# Patient Record
Sex: Female | Born: 1945
Health system: Southern US, Community
[De-identification: ages and names within clinical notes are randomized; demographics above are authoritative.]

## PROBLEM LIST (undated history)

## (undated) DIAGNOSIS — K219 Gastro-esophageal reflux disease without esophagitis: Secondary | ICD-10-CM

## (undated) DIAGNOSIS — E119 Type 2 diabetes mellitus without complications: Secondary | ICD-10-CM

## (undated) DIAGNOSIS — I10 Essential (primary) hypertension: Secondary | ICD-10-CM

## (undated) HISTORY — PX: ANKLE FRACTURE SURGERY: SHX122

---

## 2016-01-08 DIAGNOSIS — Z79899 Other long term (current) drug therapy: Secondary | ICD-10-CM | POA: Diagnosis not present

## 2016-01-08 DIAGNOSIS — I10 Essential (primary) hypertension: Secondary | ICD-10-CM | POA: Diagnosis not present

## 2016-01-08 DIAGNOSIS — E559 Vitamin D deficiency, unspecified: Secondary | ICD-10-CM | POA: Diagnosis not present

## 2016-01-08 DIAGNOSIS — R1084 Generalized abdominal pain: Secondary | ICD-10-CM | POA: Diagnosis not present

## 2016-01-08 DIAGNOSIS — E782 Mixed hyperlipidemia: Secondary | ICD-10-CM | POA: Diagnosis not present

## 2016-01-08 DIAGNOSIS — Z0000 Encounter for general adult medical examination without abnormal findings: Secondary | ICD-10-CM | POA: Diagnosis not present

## 2016-01-08 DIAGNOSIS — E1165 Type 2 diabetes mellitus with hyperglycemia: Secondary | ICD-10-CM | POA: Diagnosis not present

## 2016-04-15 DIAGNOSIS — E559 Vitamin D deficiency, unspecified: Secondary | ICD-10-CM | POA: Diagnosis not present

## 2016-04-15 DIAGNOSIS — Z79899 Other long term (current) drug therapy: Secondary | ICD-10-CM | POA: Diagnosis not present

## 2016-04-15 DIAGNOSIS — R7301 Impaired fasting glucose: Secondary | ICD-10-CM | POA: Diagnosis not present

## 2016-04-15 DIAGNOSIS — D518 Other vitamin B12 deficiency anemias: Secondary | ICD-10-CM | POA: Diagnosis not present

## 2016-04-15 DIAGNOSIS — E038 Other specified hypothyroidism: Secondary | ICD-10-CM | POA: Diagnosis not present

## 2016-04-15 DIAGNOSIS — E782 Mixed hyperlipidemia: Secondary | ICD-10-CM | POA: Diagnosis not present

## 2016-04-15 DIAGNOSIS — E1165 Type 2 diabetes mellitus with hyperglycemia: Secondary | ICD-10-CM | POA: Diagnosis not present

## 2016-04-23 DIAGNOSIS — R799 Abnormal finding of blood chemistry, unspecified: Secondary | ICD-10-CM | POA: Diagnosis not present

## 2016-04-23 DIAGNOSIS — R945 Abnormal results of liver function studies: Secondary | ICD-10-CM | POA: Diagnosis not present

## 2016-04-23 DIAGNOSIS — D649 Anemia, unspecified: Secondary | ICD-10-CM | POA: Diagnosis not present

## 2016-07-16 DIAGNOSIS — E782 Mixed hyperlipidemia: Secondary | ICD-10-CM | POA: Diagnosis not present

## 2016-07-16 DIAGNOSIS — I10 Essential (primary) hypertension: Secondary | ICD-10-CM | POA: Diagnosis not present

## 2016-07-16 DIAGNOSIS — Z1389 Encounter for screening for other disorder: Secondary | ICD-10-CM | POA: Diagnosis not present

## 2016-07-16 DIAGNOSIS — E559 Vitamin D deficiency, unspecified: Secondary | ICD-10-CM | POA: Diagnosis not present

## 2016-07-16 DIAGNOSIS — D518 Other vitamin B12 deficiency anemias: Secondary | ICD-10-CM | POA: Diagnosis not present

## 2016-07-16 DIAGNOSIS — E1165 Type 2 diabetes mellitus with hyperglycemia: Secondary | ICD-10-CM | POA: Diagnosis not present

## 2016-07-16 DIAGNOSIS — E038 Other specified hypothyroidism: Secondary | ICD-10-CM | POA: Diagnosis not present

## 2016-10-22 DIAGNOSIS — E1165 Type 2 diabetes mellitus with hyperglycemia: Secondary | ICD-10-CM | POA: Diagnosis not present

## 2016-10-22 DIAGNOSIS — E559 Vitamin D deficiency, unspecified: Secondary | ICD-10-CM | POA: Diagnosis not present

## 2016-10-22 DIAGNOSIS — Z79899 Other long term (current) drug therapy: Secondary | ICD-10-CM | POA: Diagnosis not present

## 2016-10-22 DIAGNOSIS — E038 Other specified hypothyroidism: Secondary | ICD-10-CM | POA: Diagnosis not present

## 2016-10-22 DIAGNOSIS — D518 Other vitamin B12 deficiency anemias: Secondary | ICD-10-CM | POA: Diagnosis not present

## 2016-10-22 DIAGNOSIS — I10 Essential (primary) hypertension: Secondary | ICD-10-CM | POA: Diagnosis not present

## 2016-10-22 DIAGNOSIS — E782 Mixed hyperlipidemia: Secondary | ICD-10-CM | POA: Diagnosis not present

## 2016-12-31 DIAGNOSIS — E1165 Type 2 diabetes mellitus with hyperglycemia: Secondary | ICD-10-CM | POA: Diagnosis not present

## 2016-12-31 DIAGNOSIS — F330 Major depressive disorder, recurrent, mild: Secondary | ICD-10-CM | POA: Diagnosis not present

## 2016-12-31 DIAGNOSIS — I10 Essential (primary) hypertension: Secondary | ICD-10-CM | POA: Diagnosis not present

## 2016-12-31 DIAGNOSIS — E782 Mixed hyperlipidemia: Secondary | ICD-10-CM | POA: Diagnosis not present

## 2017-01-27 DIAGNOSIS — E038 Other specified hypothyroidism: Secondary | ICD-10-CM | POA: Diagnosis not present

## 2017-01-27 DIAGNOSIS — K219 Gastro-esophageal reflux disease without esophagitis: Secondary | ICD-10-CM | POA: Diagnosis not present

## 2017-01-27 DIAGNOSIS — Z0000 Encounter for general adult medical examination without abnormal findings: Secondary | ICD-10-CM | POA: Diagnosis not present

## 2017-01-27 DIAGNOSIS — E1165 Type 2 diabetes mellitus with hyperglycemia: Secondary | ICD-10-CM | POA: Diagnosis not present

## 2017-01-27 DIAGNOSIS — E559 Vitamin D deficiency, unspecified: Secondary | ICD-10-CM | POA: Diagnosis not present

## 2017-01-27 DIAGNOSIS — D518 Other vitamin B12 deficiency anemias: Secondary | ICD-10-CM | POA: Diagnosis not present

## 2017-01-27 DIAGNOSIS — I10 Essential (primary) hypertension: Secondary | ICD-10-CM | POA: Diagnosis not present

## 2017-01-27 DIAGNOSIS — E782 Mixed hyperlipidemia: Secondary | ICD-10-CM | POA: Diagnosis not present

## 2017-02-11 DIAGNOSIS — E1165 Type 2 diabetes mellitus with hyperglycemia: Secondary | ICD-10-CM | POA: Diagnosis not present

## 2017-02-11 DIAGNOSIS — F330 Major depressive disorder, recurrent, mild: Secondary | ICD-10-CM | POA: Diagnosis not present

## 2017-02-11 DIAGNOSIS — E782 Mixed hyperlipidemia: Secondary | ICD-10-CM | POA: Diagnosis not present

## 2017-02-11 DIAGNOSIS — I10 Essential (primary) hypertension: Secondary | ICD-10-CM | POA: Diagnosis not present

## 2017-03-07 DIAGNOSIS — F330 Major depressive disorder, recurrent, mild: Secondary | ICD-10-CM | POA: Diagnosis not present

## 2017-03-07 DIAGNOSIS — I10 Essential (primary) hypertension: Secondary | ICD-10-CM | POA: Diagnosis not present

## 2017-03-07 DIAGNOSIS — E1165 Type 2 diabetes mellitus with hyperglycemia: Secondary | ICD-10-CM | POA: Diagnosis not present

## 2017-03-07 DIAGNOSIS — E782 Mixed hyperlipidemia: Secondary | ICD-10-CM | POA: Diagnosis not present

## 2017-04-07 DIAGNOSIS — I10 Essential (primary) hypertension: Secondary | ICD-10-CM | POA: Diagnosis not present

## 2017-04-07 DIAGNOSIS — F330 Major depressive disorder, recurrent, mild: Secondary | ICD-10-CM | POA: Diagnosis not present

## 2017-04-07 DIAGNOSIS — E782 Mixed hyperlipidemia: Secondary | ICD-10-CM | POA: Diagnosis not present

## 2017-04-07 DIAGNOSIS — E1165 Type 2 diabetes mellitus with hyperglycemia: Secondary | ICD-10-CM | POA: Diagnosis not present

## 2017-05-05 DIAGNOSIS — I10 Essential (primary) hypertension: Secondary | ICD-10-CM | POA: Diagnosis not present

## 2017-05-05 DIAGNOSIS — E1165 Type 2 diabetes mellitus with hyperglycemia: Secondary | ICD-10-CM | POA: Diagnosis not present

## 2017-05-05 DIAGNOSIS — E038 Other specified hypothyroidism: Secondary | ICD-10-CM | POA: Diagnosis not present

## 2017-05-05 DIAGNOSIS — E559 Vitamin D deficiency, unspecified: Secondary | ICD-10-CM | POA: Diagnosis not present

## 2017-05-05 DIAGNOSIS — E782 Mixed hyperlipidemia: Secondary | ICD-10-CM | POA: Diagnosis not present

## 2017-05-05 DIAGNOSIS — D518 Other vitamin B12 deficiency anemias: Secondary | ICD-10-CM | POA: Diagnosis not present

## 2017-05-08 DIAGNOSIS — E1165 Type 2 diabetes mellitus with hyperglycemia: Secondary | ICD-10-CM | POA: Diagnosis not present

## 2017-05-08 DIAGNOSIS — E782 Mixed hyperlipidemia: Secondary | ICD-10-CM | POA: Diagnosis not present

## 2017-05-08 DIAGNOSIS — I10 Essential (primary) hypertension: Secondary | ICD-10-CM | POA: Diagnosis not present

## 2017-05-08 DIAGNOSIS — F330 Major depressive disorder, recurrent, mild: Secondary | ICD-10-CM | POA: Diagnosis not present

## 2017-08-29 DIAGNOSIS — D518 Other vitamin B12 deficiency anemias: Secondary | ICD-10-CM | POA: Diagnosis not present

## 2017-08-29 DIAGNOSIS — E559 Vitamin D deficiency, unspecified: Secondary | ICD-10-CM | POA: Diagnosis not present

## 2017-08-29 DIAGNOSIS — E1165 Type 2 diabetes mellitus with hyperglycemia: Secondary | ICD-10-CM | POA: Diagnosis not present

## 2017-08-29 DIAGNOSIS — E782 Mixed hyperlipidemia: Secondary | ICD-10-CM | POA: Diagnosis not present

## 2017-08-29 DIAGNOSIS — I10 Essential (primary) hypertension: Secondary | ICD-10-CM | POA: Diagnosis not present

## 2017-08-29 DIAGNOSIS — E038 Other specified hypothyroidism: Secondary | ICD-10-CM | POA: Diagnosis not present

## 2017-10-29 DIAGNOSIS — E1165 Type 2 diabetes mellitus with hyperglycemia: Secondary | ICD-10-CM | POA: Diagnosis not present

## 2017-10-29 DIAGNOSIS — E782 Mixed hyperlipidemia: Secondary | ICD-10-CM | POA: Diagnosis not present

## 2017-10-29 DIAGNOSIS — I10 Essential (primary) hypertension: Secondary | ICD-10-CM | POA: Diagnosis not present

## 2017-10-29 DIAGNOSIS — F330 Major depressive disorder, recurrent, mild: Secondary | ICD-10-CM | POA: Diagnosis not present

## 2017-11-27 DIAGNOSIS — F330 Major depressive disorder, recurrent, mild: Secondary | ICD-10-CM | POA: Diagnosis not present

## 2017-11-27 DIAGNOSIS — E1165 Type 2 diabetes mellitus with hyperglycemia: Secondary | ICD-10-CM | POA: Diagnosis not present

## 2017-11-27 DIAGNOSIS — E782 Mixed hyperlipidemia: Secondary | ICD-10-CM | POA: Diagnosis not present

## 2017-11-27 DIAGNOSIS — I10 Essential (primary) hypertension: Secondary | ICD-10-CM | POA: Diagnosis not present

## 2017-12-01 DIAGNOSIS — E1165 Type 2 diabetes mellitus with hyperglycemia: Secondary | ICD-10-CM | POA: Diagnosis not present

## 2017-12-01 DIAGNOSIS — D518 Other vitamin B12 deficiency anemias: Secondary | ICD-10-CM | POA: Diagnosis not present

## 2017-12-01 DIAGNOSIS — E038 Other specified hypothyroidism: Secondary | ICD-10-CM | POA: Diagnosis not present

## 2017-12-01 DIAGNOSIS — I10 Essential (primary) hypertension: Secondary | ICD-10-CM | POA: Diagnosis not present

## 2017-12-01 DIAGNOSIS — E559 Vitamin D deficiency, unspecified: Secondary | ICD-10-CM | POA: Diagnosis not present

## 2017-12-01 DIAGNOSIS — E782 Mixed hyperlipidemia: Secondary | ICD-10-CM | POA: Diagnosis not present

## 2017-12-29 DIAGNOSIS — E782 Mixed hyperlipidemia: Secondary | ICD-10-CM | POA: Diagnosis not present

## 2017-12-29 DIAGNOSIS — F330 Major depressive disorder, recurrent, mild: Secondary | ICD-10-CM | POA: Diagnosis not present

## 2017-12-29 DIAGNOSIS — E1165 Type 2 diabetes mellitus with hyperglycemia: Secondary | ICD-10-CM | POA: Diagnosis not present

## 2017-12-29 DIAGNOSIS — I10 Essential (primary) hypertension: Secondary | ICD-10-CM | POA: Diagnosis not present

## 2018-01-19 DIAGNOSIS — S52125A Nondisplaced fracture of head of left radius, initial encounter for closed fracture: Secondary | ICD-10-CM | POA: Diagnosis not present

## 2018-01-21 DIAGNOSIS — S52134A Nondisplaced fracture of neck of right radius, initial encounter for closed fracture: Secondary | ICD-10-CM | POA: Diagnosis not present

## 2018-01-26 DIAGNOSIS — I10 Essential (primary) hypertension: Secondary | ICD-10-CM | POA: Diagnosis not present

## 2018-01-26 DIAGNOSIS — E1165 Type 2 diabetes mellitus with hyperglycemia: Secondary | ICD-10-CM | POA: Diagnosis not present

## 2018-01-26 DIAGNOSIS — E782 Mixed hyperlipidemia: Secondary | ICD-10-CM | POA: Diagnosis not present

## 2018-01-26 DIAGNOSIS — F330 Major depressive disorder, recurrent, mild: Secondary | ICD-10-CM | POA: Diagnosis not present

## 2018-03-02 DIAGNOSIS — E559 Vitamin D deficiency, unspecified: Secondary | ICD-10-CM | POA: Diagnosis not present

## 2018-03-02 DIAGNOSIS — E1165 Type 2 diabetes mellitus with hyperglycemia: Secondary | ICD-10-CM | POA: Diagnosis not present

## 2018-03-02 DIAGNOSIS — E038 Other specified hypothyroidism: Secondary | ICD-10-CM | POA: Diagnosis not present

## 2018-03-02 DIAGNOSIS — E782 Mixed hyperlipidemia: Secondary | ICD-10-CM | POA: Diagnosis not present

## 2018-03-02 DIAGNOSIS — I10 Essential (primary) hypertension: Secondary | ICD-10-CM | POA: Diagnosis not present

## 2018-03-02 DIAGNOSIS — D518 Other vitamin B12 deficiency anemias: Secondary | ICD-10-CM | POA: Diagnosis not present

## 2018-03-04 DIAGNOSIS — S52134A Nondisplaced fracture of neck of right radius, initial encounter for closed fracture: Secondary | ICD-10-CM | POA: Diagnosis not present

## 2018-03-27 DIAGNOSIS — S52134A Nondisplaced fracture of neck of right radius, initial encounter for closed fracture: Secondary | ICD-10-CM | POA: Diagnosis not present

## 2018-06-02 DIAGNOSIS — E119 Type 2 diabetes mellitus without complications: Secondary | ICD-10-CM | POA: Diagnosis not present

## 2018-06-02 DIAGNOSIS — D518 Other vitamin B12 deficiency anemias: Secondary | ICD-10-CM | POA: Diagnosis not present

## 2018-06-02 DIAGNOSIS — I10 Essential (primary) hypertension: Secondary | ICD-10-CM | POA: Diagnosis not present

## 2018-06-02 DIAGNOSIS — K219 Gastro-esophageal reflux disease without esophagitis: Secondary | ICD-10-CM | POA: Diagnosis not present

## 2018-06-02 DIAGNOSIS — E559 Vitamin D deficiency, unspecified: Secondary | ICD-10-CM | POA: Diagnosis not present

## 2018-06-02 DIAGNOSIS — E1165 Type 2 diabetes mellitus with hyperglycemia: Secondary | ICD-10-CM | POA: Diagnosis not present

## 2018-06-02 DIAGNOSIS — E038 Other specified hypothyroidism: Secondary | ICD-10-CM | POA: Diagnosis not present

## 2018-06-02 DIAGNOSIS — E782 Mixed hyperlipidemia: Secondary | ICD-10-CM | POA: Diagnosis not present

## 2018-06-16 DIAGNOSIS — S8251XA Displaced fracture of medial malleolus of right tibia, initial encounter for closed fracture: Secondary | ICD-10-CM | POA: Diagnosis not present

## 2018-06-16 DIAGNOSIS — S8261XA Displaced fracture of lateral malleolus of right fibula, initial encounter for closed fracture: Secondary | ICD-10-CM | POA: Diagnosis not present

## 2018-06-16 DIAGNOSIS — M25571 Pain in right ankle and joints of right foot: Secondary | ICD-10-CM | POA: Diagnosis not present

## 2018-06-16 DIAGNOSIS — S82841A Displaced bimalleolar fracture of right lower leg, initial encounter for closed fracture: Secondary | ICD-10-CM | POA: Diagnosis not present

## 2018-06-17 DIAGNOSIS — S82841A Displaced bimalleolar fracture of right lower leg, initial encounter for closed fracture: Secondary | ICD-10-CM | POA: Diagnosis not present

## 2018-06-18 DIAGNOSIS — S82841A Displaced bimalleolar fracture of right lower leg, initial encounter for closed fracture: Secondary | ICD-10-CM | POA: Diagnosis not present

## 2018-06-23 DIAGNOSIS — Z7982 Long term (current) use of aspirin: Secondary | ICD-10-CM | POA: Diagnosis not present

## 2018-06-23 DIAGNOSIS — F418 Other specified anxiety disorders: Secondary | ICD-10-CM | POA: Diagnosis not present

## 2018-06-23 DIAGNOSIS — I10 Essential (primary) hypertension: Secondary | ICD-10-CM | POA: Diagnosis not present

## 2018-06-23 DIAGNOSIS — S82841A Displaced bimalleolar fracture of right lower leg, initial encounter for closed fracture: Secondary | ICD-10-CM | POA: Diagnosis not present

## 2018-06-23 DIAGNOSIS — Z01810 Encounter for preprocedural cardiovascular examination: Secondary | ICD-10-CM | POA: Diagnosis not present

## 2018-06-23 DIAGNOSIS — E038 Other specified hypothyroidism: Secondary | ICD-10-CM | POA: Diagnosis not present

## 2018-06-23 DIAGNOSIS — R9431 Abnormal electrocardiogram [ECG] [EKG]: Secondary | ICD-10-CM | POA: Diagnosis not present

## 2018-06-23 DIAGNOSIS — I4510 Unspecified right bundle-branch block: Secondary | ICD-10-CM | POA: Diagnosis not present

## 2018-06-23 DIAGNOSIS — F330 Major depressive disorder, recurrent, mild: Secondary | ICD-10-CM | POA: Diagnosis not present

## 2018-06-23 DIAGNOSIS — Z7984 Long term (current) use of oral hypoglycemic drugs: Secondary | ICD-10-CM | POA: Diagnosis not present

## 2018-06-23 DIAGNOSIS — Z79899 Other long term (current) drug therapy: Secondary | ICD-10-CM | POA: Diagnosis not present

## 2018-06-23 DIAGNOSIS — R001 Bradycardia, unspecified: Secondary | ICD-10-CM | POA: Diagnosis not present

## 2018-06-23 DIAGNOSIS — E119 Type 2 diabetes mellitus without complications: Secondary | ICD-10-CM | POA: Diagnosis not present

## 2018-06-23 DIAGNOSIS — I2510 Atherosclerotic heart disease of native coronary artery without angina pectoris: Secondary | ICD-10-CM | POA: Diagnosis not present

## 2018-06-23 DIAGNOSIS — Z01818 Encounter for other preprocedural examination: Secondary | ICD-10-CM

## 2018-06-23 DIAGNOSIS — D518 Other vitamin B12 deficiency anemias: Secondary | ICD-10-CM | POA: Diagnosis not present

## 2018-06-23 DIAGNOSIS — M80071A Age-related osteoporosis with current pathological fracture, right ankle and foot, initial encounter for fracture: Secondary | ICD-10-CM | POA: Diagnosis not present

## 2018-06-23 DIAGNOSIS — E782 Mixed hyperlipidemia: Secondary | ICD-10-CM | POA: Diagnosis not present

## 2018-06-23 DIAGNOSIS — E559 Vitamin D deficiency, unspecified: Secondary | ICD-10-CM | POA: Diagnosis not present

## 2018-06-23 DIAGNOSIS — K219 Gastro-esophageal reflux disease without esophagitis: Secondary | ICD-10-CM | POA: Diagnosis not present

## 2018-06-23 DIAGNOSIS — M810 Age-related osteoporosis without current pathological fracture: Secondary | ICD-10-CM | POA: Diagnosis not present

## 2018-07-06 DIAGNOSIS — S82841A Displaced bimalleolar fracture of right lower leg, initial encounter for closed fracture: Secondary | ICD-10-CM | POA: Diagnosis not present

## 2018-07-07 DIAGNOSIS — S82841A Displaced bimalleolar fracture of right lower leg, initial encounter for closed fracture: Secondary | ICD-10-CM | POA: Diagnosis not present

## 2018-08-03 DIAGNOSIS — S82841A Displaced bimalleolar fracture of right lower leg, initial encounter for closed fracture: Secondary | ICD-10-CM | POA: Diagnosis not present

## 2018-08-31 DIAGNOSIS — S82841A Displaced bimalleolar fracture of right lower leg, initial encounter for closed fracture: Secondary | ICD-10-CM | POA: Diagnosis not present

## 2018-09-10 DIAGNOSIS — K219 Gastro-esophageal reflux disease without esophagitis: Secondary | ICD-10-CM | POA: Diagnosis not present

## 2018-09-10 DIAGNOSIS — Z79899 Other long term (current) drug therapy: Secondary | ICD-10-CM | POA: Diagnosis not present

## 2018-09-10 DIAGNOSIS — E782 Mixed hyperlipidemia: Secondary | ICD-10-CM | POA: Diagnosis not present

## 2018-09-10 DIAGNOSIS — I10 Essential (primary) hypertension: Secondary | ICD-10-CM | POA: Diagnosis not present

## 2018-09-10 DIAGNOSIS — E559 Vitamin D deficiency, unspecified: Secondary | ICD-10-CM | POA: Diagnosis not present

## 2018-09-10 DIAGNOSIS — E1165 Type 2 diabetes mellitus with hyperglycemia: Secondary | ICD-10-CM | POA: Diagnosis not present

## 2018-10-12 DIAGNOSIS — S82841A Displaced bimalleolar fracture of right lower leg, initial encounter for closed fracture: Secondary | ICD-10-CM | POA: Diagnosis not present

## 2018-12-10 DIAGNOSIS — E119 Type 2 diabetes mellitus without complications: Secondary | ICD-10-CM | POA: Diagnosis not present

## 2018-12-10 DIAGNOSIS — E038 Other specified hypothyroidism: Secondary | ICD-10-CM | POA: Diagnosis not present

## 2018-12-10 DIAGNOSIS — D518 Other vitamin B12 deficiency anemias: Secondary | ICD-10-CM | POA: Diagnosis not present

## 2018-12-10 DIAGNOSIS — E559 Vitamin D deficiency, unspecified: Secondary | ICD-10-CM | POA: Diagnosis not present

## 2018-12-10 DIAGNOSIS — I10 Essential (primary) hypertension: Secondary | ICD-10-CM | POA: Diagnosis not present

## 2018-12-10 DIAGNOSIS — E782 Mixed hyperlipidemia: Secondary | ICD-10-CM | POA: Diagnosis not present

## 2019-08-25 DIAGNOSIS — D518 Other vitamin B12 deficiency anemias: Secondary | ICD-10-CM | POA: Diagnosis not present

## 2019-08-25 DIAGNOSIS — Z0000 Encounter for general adult medical examination without abnormal findings: Secondary | ICD-10-CM | POA: Diagnosis not present

## 2019-08-25 DIAGNOSIS — E038 Other specified hypothyroidism: Secondary | ICD-10-CM | POA: Diagnosis not present

## 2019-08-25 DIAGNOSIS — E559 Vitamin D deficiency, unspecified: Secondary | ICD-10-CM | POA: Diagnosis not present

## 2019-08-25 DIAGNOSIS — I10 Essential (primary) hypertension: Secondary | ICD-10-CM | POA: Diagnosis not present

## 2019-08-25 DIAGNOSIS — K219 Gastro-esophageal reflux disease without esophagitis: Secondary | ICD-10-CM | POA: Diagnosis not present

## 2019-08-25 DIAGNOSIS — E119 Type 2 diabetes mellitus without complications: Secondary | ICD-10-CM | POA: Diagnosis not present

## 2019-08-25 DIAGNOSIS — E1165 Type 2 diabetes mellitus with hyperglycemia: Secondary | ICD-10-CM | POA: Diagnosis not present

## 2019-08-25 DIAGNOSIS — E782 Mixed hyperlipidemia: Secondary | ICD-10-CM | POA: Diagnosis not present

## 2019-09-24 DIAGNOSIS — J069 Acute upper respiratory infection, unspecified: Secondary | ICD-10-CM | POA: Diagnosis not present

## 2019-09-24 DIAGNOSIS — U071 COVID-19: Secondary | ICD-10-CM | POA: Diagnosis not present

## 2019-09-30 DIAGNOSIS — J22 Unspecified acute lower respiratory infection: Secondary | ICD-10-CM | POA: Diagnosis not present

## 2019-09-30 DIAGNOSIS — R0602 Shortness of breath: Secondary | ICD-10-CM | POA: Diagnosis not present

## 2019-10-06 DIAGNOSIS — R918 Other nonspecific abnormal finding of lung field: Secondary | ICD-10-CM | POA: Diagnosis not present

## 2020-12-04 DIAGNOSIS — E038 Other specified hypothyroidism: Secondary | ICD-10-CM | POA: Diagnosis not present

## 2020-12-04 DIAGNOSIS — I10 Essential (primary) hypertension: Secondary | ICD-10-CM | POA: Diagnosis not present

## 2020-12-04 DIAGNOSIS — E119 Type 2 diabetes mellitus without complications: Secondary | ICD-10-CM | POA: Diagnosis not present

## 2020-12-04 DIAGNOSIS — E782 Mixed hyperlipidemia: Secondary | ICD-10-CM | POA: Diagnosis not present

## 2020-12-04 DIAGNOSIS — K219 Gastro-esophageal reflux disease without esophagitis: Secondary | ICD-10-CM | POA: Diagnosis not present

## 2020-12-04 DIAGNOSIS — Z79899 Other long term (current) drug therapy: Secondary | ICD-10-CM | POA: Diagnosis not present

## 2020-12-04 DIAGNOSIS — D518 Other vitamin B12 deficiency anemias: Secondary | ICD-10-CM | POA: Diagnosis not present

## 2020-12-04 DIAGNOSIS — R69 Illness, unspecified: Secondary | ICD-10-CM | POA: Diagnosis not present

## 2020-12-04 DIAGNOSIS — E559 Vitamin D deficiency, unspecified: Secondary | ICD-10-CM | POA: Diagnosis not present

## 2021-01-01 DIAGNOSIS — J069 Acute upper respiratory infection, unspecified: Secondary | ICD-10-CM | POA: Diagnosis not present

## 2021-03-05 DIAGNOSIS — F418 Other specified anxiety disorders: Secondary | ICD-10-CM | POA: Diagnosis not present

## 2021-03-05 DIAGNOSIS — E119 Type 2 diabetes mellitus without complications: Secondary | ICD-10-CM | POA: Diagnosis not present

## 2021-03-05 DIAGNOSIS — Z79899 Other long term (current) drug therapy: Secondary | ICD-10-CM | POA: Diagnosis not present

## 2021-03-05 DIAGNOSIS — R69 Illness, unspecified: Secondary | ICD-10-CM | POA: Diagnosis not present

## 2021-03-05 DIAGNOSIS — E559 Vitamin D deficiency, unspecified: Secondary | ICD-10-CM | POA: Diagnosis not present

## 2021-03-05 DIAGNOSIS — D518 Other vitamin B12 deficiency anemias: Secondary | ICD-10-CM | POA: Diagnosis not present

## 2021-03-05 DIAGNOSIS — E782 Mixed hyperlipidemia: Secondary | ICD-10-CM | POA: Diagnosis not present

## 2021-03-05 DIAGNOSIS — I10 Essential (primary) hypertension: Secondary | ICD-10-CM | POA: Diagnosis not present

## 2021-03-05 DIAGNOSIS — K219 Gastro-esophageal reflux disease without esophagitis: Secondary | ICD-10-CM | POA: Diagnosis not present

## 2021-03-05 DIAGNOSIS — F330 Major depressive disorder, recurrent, mild: Secondary | ICD-10-CM | POA: Diagnosis not present

## 2021-03-05 DIAGNOSIS — E038 Other specified hypothyroidism: Secondary | ICD-10-CM | POA: Diagnosis not present

## 2021-03-06 DIAGNOSIS — R69 Illness, unspecified: Secondary | ICD-10-CM | POA: Diagnosis not present

## 2021-03-06 DIAGNOSIS — I10 Essential (primary) hypertension: Secondary | ICD-10-CM | POA: Diagnosis not present

## 2021-03-06 DIAGNOSIS — E119 Type 2 diabetes mellitus without complications: Secondary | ICD-10-CM | POA: Diagnosis not present

## 2021-03-06 DIAGNOSIS — D518 Other vitamin B12 deficiency anemias: Secondary | ICD-10-CM | POA: Diagnosis not present

## 2021-03-06 DIAGNOSIS — E038 Other specified hypothyroidism: Secondary | ICD-10-CM | POA: Diagnosis not present

## 2021-03-06 DIAGNOSIS — E782 Mixed hyperlipidemia: Secondary | ICD-10-CM | POA: Diagnosis not present

## 2021-03-06 DIAGNOSIS — E1165 Type 2 diabetes mellitus with hyperglycemia: Secondary | ICD-10-CM | POA: Diagnosis not present

## 2021-03-06 DIAGNOSIS — E559 Vitamin D deficiency, unspecified: Secondary | ICD-10-CM | POA: Diagnosis not present

## 2021-05-03 DIAGNOSIS — R69 Illness, unspecified: Secondary | ICD-10-CM | POA: Diagnosis not present

## 2021-05-03 DIAGNOSIS — E119 Type 2 diabetes mellitus without complications: Secondary | ICD-10-CM | POA: Diagnosis not present

## 2021-05-03 DIAGNOSIS — E782 Mixed hyperlipidemia: Secondary | ICD-10-CM | POA: Diagnosis not present

## 2021-05-03 DIAGNOSIS — E1165 Type 2 diabetes mellitus with hyperglycemia: Secondary | ICD-10-CM | POA: Diagnosis not present

## 2021-05-03 DIAGNOSIS — I10 Essential (primary) hypertension: Secondary | ICD-10-CM | POA: Diagnosis not present

## 2021-05-03 DIAGNOSIS — D518 Other vitamin B12 deficiency anemias: Secondary | ICD-10-CM | POA: Diagnosis not present

## 2021-05-03 DIAGNOSIS — E559 Vitamin D deficiency, unspecified: Secondary | ICD-10-CM | POA: Diagnosis not present

## 2021-05-03 DIAGNOSIS — E038 Other specified hypothyroidism: Secondary | ICD-10-CM | POA: Diagnosis not present

## 2021-06-04 DIAGNOSIS — D518 Other vitamin B12 deficiency anemias: Secondary | ICD-10-CM | POA: Diagnosis not present

## 2021-06-04 DIAGNOSIS — I10 Essential (primary) hypertension: Secondary | ICD-10-CM | POA: Diagnosis not present

## 2021-06-04 DIAGNOSIS — E782 Mixed hyperlipidemia: Secondary | ICD-10-CM | POA: Diagnosis not present

## 2021-06-04 DIAGNOSIS — R69 Illness, unspecified: Secondary | ICD-10-CM | POA: Diagnosis not present

## 2021-06-04 DIAGNOSIS — Z0000 Encounter for general adult medical examination without abnormal findings: Secondary | ICD-10-CM | POA: Diagnosis not present

## 2021-06-04 DIAGNOSIS — Z79899 Other long term (current) drug therapy: Secondary | ICD-10-CM | POA: Diagnosis not present

## 2021-06-04 DIAGNOSIS — E038 Other specified hypothyroidism: Secondary | ICD-10-CM | POA: Diagnosis not present

## 2021-06-04 DIAGNOSIS — E119 Type 2 diabetes mellitus without complications: Secondary | ICD-10-CM | POA: Diagnosis not present

## 2021-06-04 DIAGNOSIS — E559 Vitamin D deficiency, unspecified: Secondary | ICD-10-CM | POA: Diagnosis not present

## 2021-06-20 DIAGNOSIS — R69 Illness, unspecified: Secondary | ICD-10-CM | POA: Diagnosis not present

## 2021-06-20 DIAGNOSIS — D518 Other vitamin B12 deficiency anemias: Secondary | ICD-10-CM | POA: Diagnosis not present

## 2021-06-20 DIAGNOSIS — E038 Other specified hypothyroidism: Secondary | ICD-10-CM | POA: Diagnosis not present

## 2021-06-20 DIAGNOSIS — I10 Essential (primary) hypertension: Secondary | ICD-10-CM | POA: Diagnosis not present

## 2021-06-20 DIAGNOSIS — E1165 Type 2 diabetes mellitus with hyperglycemia: Secondary | ICD-10-CM | POA: Diagnosis not present

## 2021-06-20 DIAGNOSIS — E782 Mixed hyperlipidemia: Secondary | ICD-10-CM | POA: Diagnosis not present

## 2021-06-20 DIAGNOSIS — E119 Type 2 diabetes mellitus without complications: Secondary | ICD-10-CM | POA: Diagnosis not present

## 2021-06-20 DIAGNOSIS — E559 Vitamin D deficiency, unspecified: Secondary | ICD-10-CM | POA: Diagnosis not present

## 2021-07-23 DIAGNOSIS — R69 Illness, unspecified: Secondary | ICD-10-CM | POA: Diagnosis not present

## 2021-07-23 DIAGNOSIS — E559 Vitamin D deficiency, unspecified: Secondary | ICD-10-CM | POA: Diagnosis not present

## 2021-07-23 DIAGNOSIS — E782 Mixed hyperlipidemia: Secondary | ICD-10-CM | POA: Diagnosis not present

## 2021-07-23 DIAGNOSIS — E1165 Type 2 diabetes mellitus with hyperglycemia: Secondary | ICD-10-CM | POA: Diagnosis not present

## 2021-07-23 DIAGNOSIS — D518 Other vitamin B12 deficiency anemias: Secondary | ICD-10-CM | POA: Diagnosis not present

## 2021-07-23 DIAGNOSIS — E119 Type 2 diabetes mellitus without complications: Secondary | ICD-10-CM | POA: Diagnosis not present

## 2021-07-23 DIAGNOSIS — I10 Essential (primary) hypertension: Secondary | ICD-10-CM | POA: Diagnosis not present

## 2021-07-23 DIAGNOSIS — E038 Other specified hypothyroidism: Secondary | ICD-10-CM | POA: Diagnosis not present

## 2021-08-24 DIAGNOSIS — R69 Illness, unspecified: Secondary | ICD-10-CM | POA: Diagnosis not present

## 2021-08-24 DIAGNOSIS — E559 Vitamin D deficiency, unspecified: Secondary | ICD-10-CM | POA: Diagnosis not present

## 2021-08-24 DIAGNOSIS — I10 Essential (primary) hypertension: Secondary | ICD-10-CM | POA: Diagnosis not present

## 2021-08-24 DIAGNOSIS — E782 Mixed hyperlipidemia: Secondary | ICD-10-CM | POA: Diagnosis not present

## 2021-08-24 DIAGNOSIS — E119 Type 2 diabetes mellitus without complications: Secondary | ICD-10-CM | POA: Diagnosis not present

## 2021-08-24 DIAGNOSIS — E038 Other specified hypothyroidism: Secondary | ICD-10-CM | POA: Diagnosis not present

## 2021-08-24 DIAGNOSIS — E1165 Type 2 diabetes mellitus with hyperglycemia: Secondary | ICD-10-CM | POA: Diagnosis not present

## 2021-08-24 DIAGNOSIS — D518 Other vitamin B12 deficiency anemias: Secondary | ICD-10-CM | POA: Diagnosis not present

## 2021-09-12 DIAGNOSIS — E038 Other specified hypothyroidism: Secondary | ICD-10-CM | POA: Diagnosis not present

## 2021-09-12 DIAGNOSIS — E119 Type 2 diabetes mellitus without complications: Secondary | ICD-10-CM | POA: Diagnosis not present

## 2021-09-12 DIAGNOSIS — I10 Essential (primary) hypertension: Secondary | ICD-10-CM | POA: Diagnosis not present

## 2021-09-12 DIAGNOSIS — E782 Mixed hyperlipidemia: Secondary | ICD-10-CM | POA: Diagnosis not present

## 2021-09-12 DIAGNOSIS — D518 Other vitamin B12 deficiency anemias: Secondary | ICD-10-CM | POA: Diagnosis not present

## 2021-09-12 DIAGNOSIS — E559 Vitamin D deficiency, unspecified: Secondary | ICD-10-CM | POA: Diagnosis not present

## 2021-09-12 DIAGNOSIS — E1165 Type 2 diabetes mellitus with hyperglycemia: Secondary | ICD-10-CM | POA: Diagnosis not present

## 2021-09-12 DIAGNOSIS — R69 Illness, unspecified: Secondary | ICD-10-CM | POA: Diagnosis not present

## 2021-09-17 DIAGNOSIS — I10 Essential (primary) hypertension: Secondary | ICD-10-CM | POA: Diagnosis not present

## 2021-09-17 DIAGNOSIS — R69 Illness, unspecified: Secondary | ICD-10-CM | POA: Diagnosis not present

## 2021-09-17 DIAGNOSIS — R0602 Shortness of breath: Secondary | ICD-10-CM | POA: Diagnosis not present

## 2021-09-17 DIAGNOSIS — E782 Mixed hyperlipidemia: Secondary | ICD-10-CM | POA: Diagnosis not present

## 2021-09-17 DIAGNOSIS — E7849 Other hyperlipidemia: Secondary | ICD-10-CM | POA: Diagnosis not present

## 2021-09-17 DIAGNOSIS — Z79899 Other long term (current) drug therapy: Secondary | ICD-10-CM | POA: Diagnosis not present

## 2021-09-17 DIAGNOSIS — D518 Other vitamin B12 deficiency anemias: Secondary | ICD-10-CM | POA: Diagnosis not present

## 2021-09-17 DIAGNOSIS — K219 Gastro-esophageal reflux disease without esophagitis: Secondary | ICD-10-CM | POA: Diagnosis not present

## 2021-09-17 DIAGNOSIS — E119 Type 2 diabetes mellitus without complications: Secondary | ICD-10-CM | POA: Diagnosis not present

## 2021-09-17 DIAGNOSIS — E559 Vitamin D deficiency, unspecified: Secondary | ICD-10-CM | POA: Diagnosis not present

## 2021-09-17 DIAGNOSIS — E038 Other specified hypothyroidism: Secondary | ICD-10-CM | POA: Diagnosis not present

## 2021-09-18 DIAGNOSIS — R011 Cardiac murmur, unspecified: Secondary | ICD-10-CM | POA: Diagnosis not present

## 2021-09-19 DIAGNOSIS — R0989 Other specified symptoms and signs involving the circulatory and respiratory systems: Secondary | ICD-10-CM | POA: Diagnosis not present

## 2021-09-19 DIAGNOSIS — I6523 Occlusion and stenosis of bilateral carotid arteries: Secondary | ICD-10-CM | POA: Diagnosis not present

## 2021-09-21 DIAGNOSIS — I77811 Abdominal aortic ectasia: Secondary | ICD-10-CM | POA: Diagnosis not present

## 2021-09-25 DIAGNOSIS — R221 Localized swelling, mass and lump, neck: Secondary | ICD-10-CM | POA: Diagnosis not present

## 2021-09-25 DIAGNOSIS — E042 Nontoxic multinodular goiter: Secondary | ICD-10-CM | POA: Diagnosis not present

## 2021-09-28 DIAGNOSIS — D492 Neoplasm of unspecified behavior of bone, soft tissue, and skin: Secondary | ICD-10-CM | POA: Diagnosis not present

## 2021-09-28 DIAGNOSIS — R2242 Localized swelling, mass and lump, left lower limb: Secondary | ICD-10-CM | POA: Diagnosis not present

## 2021-10-24 DIAGNOSIS — E119 Type 2 diabetes mellitus without complications: Secondary | ICD-10-CM | POA: Diagnosis not present

## 2021-10-24 DIAGNOSIS — E1165 Type 2 diabetes mellitus with hyperglycemia: Secondary | ICD-10-CM | POA: Diagnosis not present

## 2021-10-24 DIAGNOSIS — E038 Other specified hypothyroidism: Secondary | ICD-10-CM | POA: Diagnosis not present

## 2021-10-24 DIAGNOSIS — E559 Vitamin D deficiency, unspecified: Secondary | ICD-10-CM | POA: Diagnosis not present

## 2021-10-24 DIAGNOSIS — E782 Mixed hyperlipidemia: Secondary | ICD-10-CM | POA: Diagnosis not present

## 2021-10-24 DIAGNOSIS — R69 Illness, unspecified: Secondary | ICD-10-CM | POA: Diagnosis not present

## 2021-10-24 DIAGNOSIS — D518 Other vitamin B12 deficiency anemias: Secondary | ICD-10-CM | POA: Diagnosis not present

## 2021-10-24 DIAGNOSIS — I10 Essential (primary) hypertension: Secondary | ICD-10-CM | POA: Diagnosis not present

## 2021-11-06 DIAGNOSIS — E038 Other specified hypothyroidism: Secondary | ICD-10-CM | POA: Diagnosis not present

## 2021-11-06 DIAGNOSIS — E559 Vitamin D deficiency, unspecified: Secondary | ICD-10-CM | POA: Diagnosis not present

## 2021-11-06 DIAGNOSIS — E119 Type 2 diabetes mellitus without complications: Secondary | ICD-10-CM | POA: Diagnosis not present

## 2021-11-06 DIAGNOSIS — D518 Other vitamin B12 deficiency anemias: Secondary | ICD-10-CM | POA: Diagnosis not present

## 2021-11-06 DIAGNOSIS — E782 Mixed hyperlipidemia: Secondary | ICD-10-CM | POA: Diagnosis not present

## 2021-11-06 DIAGNOSIS — E1165 Type 2 diabetes mellitus with hyperglycemia: Secondary | ICD-10-CM | POA: Diagnosis not present

## 2021-11-06 DIAGNOSIS — R69 Illness, unspecified: Secondary | ICD-10-CM | POA: Diagnosis not present

## 2021-11-06 DIAGNOSIS — I10 Essential (primary) hypertension: Secondary | ICD-10-CM | POA: Diagnosis not present

## 2021-12-14 DIAGNOSIS — D518 Other vitamin B12 deficiency anemias: Secondary | ICD-10-CM | POA: Diagnosis not present

## 2021-12-14 DIAGNOSIS — E038 Other specified hypothyroidism: Secondary | ICD-10-CM | POA: Diagnosis not present

## 2021-12-14 DIAGNOSIS — E119 Type 2 diabetes mellitus without complications: Secondary | ICD-10-CM | POA: Diagnosis not present

## 2021-12-14 DIAGNOSIS — I10 Essential (primary) hypertension: Secondary | ICD-10-CM | POA: Diagnosis not present

## 2021-12-14 DIAGNOSIS — R69 Illness, unspecified: Secondary | ICD-10-CM | POA: Diagnosis not present

## 2021-12-14 DIAGNOSIS — E559 Vitamin D deficiency, unspecified: Secondary | ICD-10-CM | POA: Diagnosis not present

## 2021-12-14 DIAGNOSIS — E1165 Type 2 diabetes mellitus with hyperglycemia: Secondary | ICD-10-CM | POA: Diagnosis not present

## 2021-12-14 DIAGNOSIS — E782 Mixed hyperlipidemia: Secondary | ICD-10-CM | POA: Diagnosis not present

## 2021-12-17 DIAGNOSIS — R69 Illness, unspecified: Secondary | ICD-10-CM | POA: Diagnosis not present

## 2021-12-17 DIAGNOSIS — E559 Vitamin D deficiency, unspecified: Secondary | ICD-10-CM | POA: Diagnosis not present

## 2021-12-17 DIAGNOSIS — J22 Unspecified acute lower respiratory infection: Secondary | ICD-10-CM | POA: Diagnosis not present

## 2021-12-17 DIAGNOSIS — K219 Gastro-esophageal reflux disease without esophagitis: Secondary | ICD-10-CM | POA: Diagnosis not present

## 2021-12-17 DIAGNOSIS — I10 Essential (primary) hypertension: Secondary | ICD-10-CM | POA: Diagnosis not present

## 2021-12-17 DIAGNOSIS — E1165 Type 2 diabetes mellitus with hyperglycemia: Secondary | ICD-10-CM | POA: Diagnosis not present

## 2021-12-17 DIAGNOSIS — E038 Other specified hypothyroidism: Secondary | ICD-10-CM | POA: Diagnosis not present

## 2021-12-17 DIAGNOSIS — E7849 Other hyperlipidemia: Secondary | ICD-10-CM | POA: Diagnosis not present

## 2021-12-17 DIAGNOSIS — E119 Type 2 diabetes mellitus without complications: Secondary | ICD-10-CM | POA: Diagnosis not present

## 2021-12-17 DIAGNOSIS — D518 Other vitamin B12 deficiency anemias: Secondary | ICD-10-CM | POA: Diagnosis not present

## 2021-12-17 DIAGNOSIS — Z79899 Other long term (current) drug therapy: Secondary | ICD-10-CM | POA: Diagnosis not present

## 2021-12-27 DIAGNOSIS — B351 Tinea unguium: Secondary | ICD-10-CM | POA: Diagnosis not present

## 2021-12-27 DIAGNOSIS — B070 Plantar wart: Secondary | ICD-10-CM | POA: Diagnosis not present

## 2021-12-27 DIAGNOSIS — E1140 Type 2 diabetes mellitus with diabetic neuropathy, unspecified: Secondary | ICD-10-CM | POA: Diagnosis not present

## 2021-12-27 DIAGNOSIS — L988 Other specified disorders of the skin and subcutaneous tissue: Secondary | ICD-10-CM | POA: Diagnosis not present

## 2021-12-27 DIAGNOSIS — M79671 Pain in right foot: Secondary | ICD-10-CM | POA: Diagnosis not present

## 2021-12-27 DIAGNOSIS — M79672 Pain in left foot: Secondary | ICD-10-CM | POA: Diagnosis not present

## 2022-01-01 DIAGNOSIS — J069 Acute upper respiratory infection, unspecified: Secondary | ICD-10-CM | POA: Diagnosis not present

## 2022-01-14 DIAGNOSIS — E559 Vitamin D deficiency, unspecified: Secondary | ICD-10-CM | POA: Diagnosis not present

## 2022-01-14 DIAGNOSIS — E119 Type 2 diabetes mellitus without complications: Secondary | ICD-10-CM | POA: Diagnosis not present

## 2022-01-14 DIAGNOSIS — R69 Illness, unspecified: Secondary | ICD-10-CM | POA: Diagnosis not present

## 2022-01-14 DIAGNOSIS — E1140 Type 2 diabetes mellitus with diabetic neuropathy, unspecified: Secondary | ICD-10-CM | POA: Diagnosis not present

## 2022-01-14 DIAGNOSIS — E1165 Type 2 diabetes mellitus with hyperglycemia: Secondary | ICD-10-CM | POA: Diagnosis not present

## 2022-01-14 DIAGNOSIS — E038 Other specified hypothyroidism: Secondary | ICD-10-CM | POA: Diagnosis not present

## 2022-01-14 DIAGNOSIS — E782 Mixed hyperlipidemia: Secondary | ICD-10-CM | POA: Diagnosis not present

## 2022-01-14 DIAGNOSIS — D518 Other vitamin B12 deficiency anemias: Secondary | ICD-10-CM | POA: Diagnosis not present

## 2022-01-14 DIAGNOSIS — I10 Essential (primary) hypertension: Secondary | ICD-10-CM | POA: Diagnosis not present

## 2022-02-15 DIAGNOSIS — D518 Other vitamin B12 deficiency anemias: Secondary | ICD-10-CM | POA: Diagnosis not present

## 2022-02-15 DIAGNOSIS — R1013 Epigastric pain: Secondary | ICD-10-CM | POA: Diagnosis not present

## 2022-02-15 DIAGNOSIS — E038 Other specified hypothyroidism: Secondary | ICD-10-CM | POA: Diagnosis not present

## 2022-02-15 DIAGNOSIS — I10 Essential (primary) hypertension: Secondary | ICD-10-CM | POA: Diagnosis not present

## 2022-02-15 DIAGNOSIS — E1165 Type 2 diabetes mellitus with hyperglycemia: Secondary | ICD-10-CM | POA: Diagnosis not present

## 2022-02-15 DIAGNOSIS — E782 Mixed hyperlipidemia: Secondary | ICD-10-CM | POA: Diagnosis not present

## 2022-02-15 DIAGNOSIS — R531 Weakness: Secondary | ICD-10-CM | POA: Diagnosis not present

## 2022-02-15 DIAGNOSIS — E860 Dehydration: Secondary | ICD-10-CM | POA: Diagnosis not present

## 2022-02-15 DIAGNOSIS — E1140 Type 2 diabetes mellitus with diabetic neuropathy, unspecified: Secondary | ICD-10-CM | POA: Diagnosis not present

## 2022-02-15 DIAGNOSIS — R5383 Other fatigue: Secondary | ICD-10-CM | POA: Diagnosis not present

## 2022-02-15 DIAGNOSIS — E119 Type 2 diabetes mellitus without complications: Secondary | ICD-10-CM | POA: Diagnosis not present

## 2022-02-15 DIAGNOSIS — E559 Vitamin D deficiency, unspecified: Secondary | ICD-10-CM | POA: Diagnosis not present

## 2022-02-15 DIAGNOSIS — R69 Illness, unspecified: Secondary | ICD-10-CM | POA: Diagnosis not present

## 2022-02-15 DIAGNOSIS — R1110 Vomiting, unspecified: Secondary | ICD-10-CM | POA: Diagnosis not present

## 2022-02-15 DIAGNOSIS — N390 Urinary tract infection, site not specified: Secondary | ICD-10-CM | POA: Diagnosis not present

## 2022-02-16 DIAGNOSIS — N281 Cyst of kidney, acquired: Secondary | ICD-10-CM | POA: Diagnosis not present

## 2022-02-16 DIAGNOSIS — R109 Unspecified abdominal pain: Secondary | ICD-10-CM | POA: Diagnosis not present

## 2022-02-16 DIAGNOSIS — I10 Essential (primary) hypertension: Secondary | ICD-10-CM | POA: Diagnosis not present

## 2022-02-16 DIAGNOSIS — I4510 Unspecified right bundle-branch block: Secondary | ICD-10-CM | POA: Diagnosis not present

## 2022-02-16 DIAGNOSIS — Z6841 Body Mass Index (BMI) 40.0 and over, adult: Secondary | ICD-10-CM | POA: Diagnosis not present

## 2022-02-16 DIAGNOSIS — K8010 Calculus of gallbladder with chronic cholecystitis without obstruction: Secondary | ICD-10-CM | POA: Diagnosis not present

## 2022-02-16 DIAGNOSIS — Z79899 Other long term (current) drug therapy: Secondary | ICD-10-CM | POA: Diagnosis not present

## 2022-02-16 DIAGNOSIS — K810 Acute cholecystitis: Secondary | ICD-10-CM | POA: Diagnosis not present

## 2022-02-16 DIAGNOSIS — K760 Fatty (change of) liver, not elsewhere classified: Secondary | ICD-10-CM | POA: Diagnosis not present

## 2022-02-16 DIAGNOSIS — R6511 Systemic inflammatory response syndrome (SIRS) of non-infectious origin with acute organ dysfunction: Secondary | ICD-10-CM | POA: Diagnosis not present

## 2022-02-16 DIAGNOSIS — K5900 Constipation, unspecified: Secondary | ICD-10-CM | POA: Diagnosis not present

## 2022-02-16 DIAGNOSIS — Z7982 Long term (current) use of aspirin: Secondary | ICD-10-CM | POA: Diagnosis not present

## 2022-02-16 DIAGNOSIS — R4182 Altered mental status, unspecified: Secondary | ICD-10-CM | POA: Diagnosis not present

## 2022-02-16 DIAGNOSIS — Z888 Allergy status to other drugs, medicaments and biological substances status: Secondary | ICD-10-CM | POA: Diagnosis not present

## 2022-02-16 DIAGNOSIS — K219 Gastro-esophageal reflux disease without esophagitis: Secondary | ICD-10-CM | POA: Diagnosis not present

## 2022-02-16 DIAGNOSIS — I361 Nonrheumatic tricuspid (valve) insufficiency: Secondary | ICD-10-CM | POA: Diagnosis not present

## 2022-02-16 DIAGNOSIS — I700 Atherosclerosis of aorta: Secondary | ICD-10-CM | POA: Diagnosis not present

## 2022-02-16 DIAGNOSIS — E119 Type 2 diabetes mellitus without complications: Secondary | ICD-10-CM | POA: Diagnosis not present

## 2022-02-16 DIAGNOSIS — K5730 Diverticulosis of large intestine without perforation or abscess without bleeding: Secondary | ICD-10-CM | POA: Diagnosis not present

## 2022-02-16 DIAGNOSIS — K828 Other specified diseases of gallbladder: Secondary | ICD-10-CM | POA: Diagnosis not present

## 2022-02-16 DIAGNOSIS — E785 Hyperlipidemia, unspecified: Secondary | ICD-10-CM | POA: Diagnosis not present

## 2022-02-16 DIAGNOSIS — K8050 Calculus of bile duct without cholangitis or cholecystitis without obstruction: Secondary | ICD-10-CM | POA: Diagnosis not present

## 2022-02-16 DIAGNOSIS — K7460 Unspecified cirrhosis of liver: Secondary | ICD-10-CM | POA: Diagnosis not present

## 2022-02-16 DIAGNOSIS — E871 Hypo-osmolality and hyponatremia: Secondary | ICD-10-CM | POA: Diagnosis not present

## 2022-02-16 DIAGNOSIS — E860 Dehydration: Secondary | ICD-10-CM | POA: Diagnosis not present

## 2022-02-16 DIAGNOSIS — N179 Acute kidney failure, unspecified: Secondary | ICD-10-CM | POA: Diagnosis not present

## 2022-02-16 DIAGNOSIS — E7800 Pure hypercholesterolemia, unspecified: Secondary | ICD-10-CM | POA: Diagnosis not present

## 2022-02-16 DIAGNOSIS — K8000 Calculus of gallbladder with acute cholecystitis without obstruction: Secondary | ICD-10-CM | POA: Diagnosis not present

## 2022-02-18 DIAGNOSIS — I4510 Unspecified right bundle-branch block: Secondary | ICD-10-CM

## 2022-02-18 DIAGNOSIS — I361 Nonrheumatic tricuspid (valve) insufficiency: Secondary | ICD-10-CM

## 2022-03-05 DIAGNOSIS — N179 Acute kidney failure, unspecified: Secondary | ICD-10-CM | POA: Diagnosis not present

## 2022-03-05 DIAGNOSIS — K819 Cholecystitis, unspecified: Secondary | ICD-10-CM | POA: Diagnosis not present

## 2022-03-07 ENCOUNTER — Telehealth: Payer: Self-pay | Admitting: Internal Medicine

## 2022-03-07 DIAGNOSIS — Z9049 Acquired absence of other specified parts of digestive tract: Secondary | ICD-10-CM | POA: Diagnosis not present

## 2022-03-07 DIAGNOSIS — K8030 Calculus of bile duct with cholangitis, unspecified, without obstruction: Secondary | ICD-10-CM | POA: Diagnosis not present

## 2022-03-07 DIAGNOSIS — I2510 Atherosclerotic heart disease of native coronary artery without angina pectoris: Secondary | ICD-10-CM | POA: Diagnosis not present

## 2022-03-07 DIAGNOSIS — I700 Atherosclerosis of aorta: Secondary | ICD-10-CM | POA: Diagnosis not present

## 2022-03-07 DIAGNOSIS — R1013 Epigastric pain: Secondary | ICD-10-CM | POA: Diagnosis not present

## 2022-03-07 DIAGNOSIS — R932 Abnormal findings on diagnostic imaging of liver and biliary tract: Secondary | ICD-10-CM | POA: Diagnosis not present

## 2022-03-07 DIAGNOSIS — K838 Other specified diseases of biliary tract: Secondary | ICD-10-CM | POA: Diagnosis not present

## 2022-03-07 DIAGNOSIS — K8050 Calculus of bile duct without cholangitis or cholecystitis without obstruction: Secondary | ICD-10-CM | POA: Diagnosis not present

## 2022-03-07 DIAGNOSIS — R079 Chest pain, unspecified: Secondary | ICD-10-CM | POA: Diagnosis not present

## 2022-03-07 DIAGNOSIS — N2889 Other specified disorders of kidney and ureter: Secondary | ICD-10-CM | POA: Diagnosis not present

## 2022-03-07 DIAGNOSIS — R1011 Right upper quadrant pain: Secondary | ICD-10-CM | POA: Diagnosis not present

## 2022-03-07 DIAGNOSIS — T8143XA Infection following a procedure, organ and space surgical site, initial encounter: Secondary | ICD-10-CM | POA: Diagnosis not present

## 2022-03-07 NOTE — Telephone Encounter (Signed)
Received a phone call from Facility: [facility]  ?Requesting MD: Sharen Heck ?Patient with h/o HTN, HLD, h/o hyponatremia, DM, GERD presenting with choledocholithiasis.  Gallbladder removed on 3/28, angiogram with ?obstructing lesion.  Last night with upper abdominal pain.  CT with abscess and mild CBD dilatation, filling defect.  Concern for choledocholithiasis.  Hemodynamically stable.  Bili 2.8, AST 378, ALT 146, lipase 475. ?Plan of care: Needs transfer for ERCP and [facility] does not have GI capability.  I have reached out to Drs. Candis Schatz and Outlaw to ensure that they are in agreement with transfer. ?The patient will be accepted for admission to Med Surg at [facility] when bed is available, assuming GI is in agreement. ? ?Note:  Patient is the grandmother of Merlene Laughter (PCCM NP). ? ? ?Nursing staff, Please call the [place] number at the top of Amion at the time of the patient's arrival so that the patient can be paged to the admitting physician. ? ? ?Carlyon Shadow, M.D. ?[facility]  ?

## 2022-03-08 ENCOUNTER — Encounter (HOSPITAL_COMMUNITY): Payer: Self-pay | Admitting: Internal Medicine

## 2022-03-08 ENCOUNTER — Other Ambulatory Visit: Payer: Self-pay

## 2022-03-08 ENCOUNTER — Inpatient Hospital Stay (HOSPITAL_COMMUNITY)
Admission: AD | Admit: 2022-03-08 | Discharge: 2022-03-13 | DRG: 862 | Disposition: A | Discharge status: Home / Self Care | Payer: Medicare HMO | Source: Other Acute Inpatient Hospital | Attending: Internal Medicine | Admitting: Internal Medicine

## 2022-03-08 DIAGNOSIS — Z79899 Other long term (current) drug therapy: Secondary | ICD-10-CM

## 2022-03-08 DIAGNOSIS — E1169 Type 2 diabetes mellitus with other specified complication: Secondary | ICD-10-CM | POA: Diagnosis not present

## 2022-03-08 DIAGNOSIS — K8051 Calculus of bile duct without cholangitis or cholecystitis with obstruction: Secondary | ICD-10-CM | POA: Diagnosis present

## 2022-03-08 DIAGNOSIS — Z7982 Long term (current) use of aspirin: Secondary | ICD-10-CM | POA: Diagnosis not present

## 2022-03-08 DIAGNOSIS — Y838 Other surgical procedures as the cause of abnormal reaction of the patient, or of later complication, without mention of misadventure at the time of the procedure: Secondary | ICD-10-CM | POA: Diagnosis present

## 2022-03-08 DIAGNOSIS — Z6839 Body mass index (BMI) 39.0-39.9, adult: Secondary | ICD-10-CM | POA: Diagnosis not present

## 2022-03-08 DIAGNOSIS — D539 Nutritional anemia, unspecified: Secondary | ICD-10-CM | POA: Diagnosis not present

## 2022-03-08 DIAGNOSIS — R932 Abnormal findings on diagnostic imaging of liver and biliary tract: Secondary | ICD-10-CM | POA: Diagnosis not present

## 2022-03-08 DIAGNOSIS — T8143XA Infection following a procedure, organ and space surgical site, initial encounter: Principal | ICD-10-CM | POA: Diagnosis present

## 2022-03-08 DIAGNOSIS — K219 Gastro-esophageal reflux disease without esophagitis: Secondary | ICD-10-CM | POA: Diagnosis not present

## 2022-03-08 DIAGNOSIS — Z9049 Acquired absence of other specified parts of digestive tract: Secondary | ICD-10-CM | POA: Diagnosis not present

## 2022-03-08 DIAGNOSIS — I10 Essential (primary) hypertension: Secondary | ICD-10-CM

## 2022-03-08 DIAGNOSIS — K828 Other specified diseases of gallbladder: Secondary | ICD-10-CM | POA: Diagnosis not present

## 2022-03-08 DIAGNOSIS — E785 Hyperlipidemia, unspecified: Secondary | ICD-10-CM | POA: Diagnosis not present

## 2022-03-08 DIAGNOSIS — Z7985 Long-term (current) use of injectable non-insulin antidiabetic drugs: Secondary | ICD-10-CM | POA: Diagnosis not present

## 2022-03-08 DIAGNOSIS — R7989 Other specified abnormal findings of blood chemistry: Secondary | ICD-10-CM

## 2022-03-08 DIAGNOSIS — K8050 Calculus of bile duct without cholangitis or cholecystitis without obstruction: Secondary | ICD-10-CM | POA: Diagnosis not present

## 2022-03-08 DIAGNOSIS — E669 Obesity, unspecified: Secondary | ICD-10-CM | POA: Diagnosis not present

## 2022-03-08 DIAGNOSIS — R1013 Epigastric pain: Secondary | ICD-10-CM | POA: Diagnosis not present

## 2022-03-08 DIAGNOSIS — K838 Other specified diseases of biliary tract: Secondary | ICD-10-CM | POA: Diagnosis not present

## 2022-03-08 DIAGNOSIS — E119 Type 2 diabetes mellitus without complications: Secondary | ICD-10-CM

## 2022-03-08 DIAGNOSIS — K651 Peritoneal abscess: Secondary | ICD-10-CM | POA: Diagnosis not present

## 2022-03-08 DIAGNOSIS — R748 Abnormal levels of other serum enzymes: Secondary | ICD-10-CM | POA: Diagnosis not present

## 2022-03-08 DIAGNOSIS — K8030 Calculus of bile duct with cholangitis, unspecified, without obstruction: Secondary | ICD-10-CM | POA: Diagnosis not present

## 2022-03-08 HISTORY — DX: Gastro-esophageal reflux disease without esophagitis: K21.9

## 2022-03-08 HISTORY — DX: Type 2 diabetes mellitus without complications: E11.9

## 2022-03-08 HISTORY — DX: Essential (primary) hypertension: I10

## 2022-03-08 LAB — CBC WITH DIFFERENTIAL/PLATELET
Abs Immature Granulocytes: 0.01 10*3/uL (ref 0.00–0.07)
Basophils Absolute: 0.1 10*3/uL (ref 0.0–0.1)
Basophils Relative: 1 %
Eosinophils Absolute: 0.1 10*3/uL (ref 0.0–0.5)
Eosinophils Relative: 1 %
HCT: 30 % — ABNORMAL LOW (ref 36.0–46.0)
Hemoglobin: 9.4 g/dL — ABNORMAL LOW (ref 12.0–15.0)
Immature Granulocytes: 0 %
Lymphocytes Relative: 31 %
Lymphs Abs: 2.1 10*3/uL (ref 0.7–4.0)
MCH: 31.4 pg (ref 26.0–34.0)
MCHC: 31.3 g/dL (ref 30.0–36.0)
MCV: 100.3 fL — ABNORMAL HIGH (ref 80.0–100.0)
Monocytes Absolute: 0.7 10*3/uL (ref 0.1–1.0)
Monocytes Relative: 10 %
Neutro Abs: 3.9 10*3/uL (ref 1.7–7.7)
Neutrophils Relative %: 57 %
Platelets: 214 10*3/uL (ref 150–400)
RBC: 2.99 MIL/uL — ABNORMAL LOW (ref 3.87–5.11)
RDW: 14.3 % (ref 11.5–15.5)
WBC: 6.9 10*3/uL (ref 4.0–10.5)
nRBC: 0 % (ref 0.0–0.2)

## 2022-03-08 LAB — COMPREHENSIVE METABOLIC PANEL
ALT: 78 U/L — ABNORMAL HIGH (ref 0–44)
AST: 151 U/L — ABNORMAL HIGH (ref 15–41)
Albumin: 2.5 g/dL — ABNORMAL LOW (ref 3.5–5.0)
Alkaline Phosphatase: 222 U/L — ABNORMAL HIGH (ref 38–126)
Anion gap: 7 (ref 5–15)
BUN: 13 mg/dL (ref 8–23)
CO2: 33 mmol/L — ABNORMAL HIGH (ref 22–32)
Calcium: 8.5 mg/dL — ABNORMAL LOW (ref 8.9–10.3)
Chloride: 99 mmol/L (ref 98–111)
Creatinine, Ser: 0.85 mg/dL (ref 0.44–1.00)
GFR, Estimated: >60 mL/min (ref >60)
Glucose, Bld: 104 mg/dL — ABNORMAL HIGH (ref 70–99)
Potassium: 4.1 mmol/L (ref 3.5–5.1)
Sodium: 139 mmol/L (ref 135–145)
Total Bilirubin: 1.5 mg/dL — ABNORMAL HIGH (ref 0.3–1.2)
Total Protein: 8.1 g/dL (ref 6.5–8.1)

## 2022-03-08 LAB — FOLATE: Folate: 37.1 ng/mL (ref >5.9)

## 2022-03-08 LAB — IRON AND TIBC
Iron: 42 ug/dL (ref 28–170)
Saturation Ratios: 18 % (ref 10.4–31.8)
TIBC: 236 ug/dL — ABNORMAL LOW (ref 250–450)
UIBC: 194 ug/dL

## 2022-03-08 LAB — HEMOGLOBIN A1C
Hgb A1c MFr Bld: 6.2 % — ABNORMAL HIGH (ref 4.8–5.6)
Mean Plasma Glucose: 131.24 mg/dL

## 2022-03-08 LAB — VITAMIN B12: Vitamin B-12: 1405 pg/mL — ABNORMAL HIGH (ref 180–914)

## 2022-03-08 MED ORDER — INSULIN ASPART 100 UNIT/ML IJ SOLN
0.0000 [IU] | Freq: Three times a day (TID) | INTRAMUSCULAR | Status: DC
  Administered 2022-03-09: 3 [IU] via SUBCUTANEOUS
  Administered 2022-03-12: 1 [IU] via SUBCUTANEOUS
  Administered 2022-03-12: 2 [IU] via SUBCUTANEOUS

## 2022-03-08 MED ORDER — SODIUM CHLORIDE 0.9 % IV SOLN: Freq: Once | INTRAVENOUS | Status: AC

## 2022-03-08 MED ORDER — MORPHINE SULFATE (PF) 2 MG/ML IV SOLN: 2.0000 mg | INTRAVENOUS | Status: DC | PRN

## 2022-03-08 MED ORDER — PIPERACILLIN-TAZOBACTAM 3.375 G IVPB
3.3750 g | Freq: Three times a day (TID) | INTRAVENOUS | Status: DC
  Administered 2022-03-09 – 2022-03-13 (×13): 3.375 g via INTRAVENOUS
  Filled 2022-03-08 (×13): qty 50

## 2022-03-08 MED ORDER — ENOXAPARIN SODIUM 40 MG/0.4ML IJ SOSY
40.0000 mg | PREFILLED_SYRINGE | INTRAMUSCULAR | Status: DC
  Administered 2022-03-08 – 2022-03-09 (×2): 40 mg via SUBCUTANEOUS
  Filled 2022-03-08 (×2): qty 0.4

## 2022-03-08 MED ORDER — OXYCODONE HCL 5 MG PO TABS
5.0000 mg | ORAL_TABLET | ORAL | Status: DC | PRN
  Administered 2022-03-11: 5 mg via ORAL
  Filled 2022-03-08: qty 1

## 2022-03-08 MED ORDER — ACETAMINOPHEN 325 MG PO TABS: 650.0000 mg | ORAL_TABLET | Freq: Four times a day (QID) | ORAL | Status: DC | PRN

## 2022-03-08 MED ORDER — PROCHLORPERAZINE EDISYLATE 10 MG/2ML IJ SOLN
10.0000 mg | Freq: Four times a day (QID) | INTRAMUSCULAR | Status: DC | PRN
Start: 2022-03-08 — End: 2022-03-13

## 2022-03-08 MED ORDER — PANTOPRAZOLE SODIUM 40 MG PO TBEC
40.0000 mg | DELAYED_RELEASE_TABLET | Freq: Every day | ORAL | Status: DC
Start: 2022-03-08 — End: 2022-03-13
  Administered 2022-03-08 – 2022-03-13 (×5): 40 mg via ORAL
  Filled 2022-03-08 (×6): qty 1

## 2022-03-08 MED ORDER — ACETAMINOPHEN 650 MG RE SUPP: 650.0000 mg | Freq: Four times a day (QID) | RECTAL | Status: DC | PRN

## 2022-03-08 NOTE — Progress Notes (Signed)
Patient discussed with hospital team and labs and MRCP report noted. Have scheduled for ERCP at 8am tomorrow and will see in AM beforehand. ?

## 2022-03-08 NOTE — Progress Notes (Addendum)
Pharmacy Antibiotic Note ? ?Tricia Butler is a 76 y.o. female admitted on 03/08/2022 with intra-abdominal infection.  Transferred from [facility] with suspected choledocholithiasis s/p recent cholecystectomy (02/19/22); cholangiogram showed retained stones. Pharmacy has been consulted for Zosyn dosing. ? ?[facility] pharmacy reports that she was given antibiotics while admitted:  Invanz 1g IV q24h, most recent dose on 4/14 at 9am. ? ?Plan: ?Zosyn 3.375g IV q8h (4 hour infusion).  ?Pharmacy will sign off consult, but will f/u peripherally for renal function, culture results, and clinical course. ? ?Height: '5\' 3"'$  (160 cm) ?Weight: 101.2 kg (223 lb) ?IBW/kg (Calculated) : 52.4 ? ?Temp (24hrs), Avg:98.4 ?F (36.9 ?C), Min:98 ?F (36.7 ?C), Max:98.7 ?F (37.1 ?C) ? ?Recent Labs  ?Lab 03/08/22 ?1706  ?WBC 6.9  ?CREATININE 0.85  ?  ?Estimated Creatinine Clearance: 62.9 mL/min (by C-G formula based on SCr of 0.85 mg/dL).   ? ?No Known Allergies ? ?Thank you for allowing pharmacy to be a part of this patient?s care. ? ?Gretta Arab PharmD, BCPS ?Clinical Pharmacist ?[facility] main pharmacy [phone] ?03/08/2022 5:53 PM ?

## 2022-03-08 NOTE — H&P (Signed)
?History and Physical  ? ? ?Patient: Tricia Butler MWU:[id number] DOB: Jan 26, 1946 ?DOA: 03/08/2022 ?DOS: the patient was seen and examined on 03/08/2022 ?PCP: System, Provider Not In  ?Patient coming from: Outside Hospital ? ?Chief Complaint: Abdominal pain ? ?HPI: Artie Dannae Butler is a 76 y.o. female with medical history significant of DM2, GERD, HTN. Presenting with abdominal pain. She had a recent cholecystectomy (02/19/22). Apparently a cholangiogram was performed and there were retained stones identified. The patient presented to [facility] 2 days ago with abdominal pain. She reports it was sudden onset. It was located in the RUQ. It was sharp. She didn't have any fever, nausea, or vomiting. Work up, including an MRCP, revealed CBD dilation w/ concern for an obstructing stone. [facility] hospital requested transfer to [facility] d/t not having GI services available. ?   ?Review of Systems: As mentioned in the history of present illness. All other systems reviewed and are negative. ?Past Medical History:  ?Diagnosis Date  ? Diabetes mellitus without complication ([place])   ? GERD (gastroesophageal reflux disease)   ? Hypertension   ? ?PSHx ?    - Cholecystectomy ? ?Social History:  reports that she has never smoked. She has never used smokeless tobacco. She reports that she does not use drugs. No history on file for alcohol use. ? ?No Known Allergies ? ?Fam Hx ?Reviewed. Non-contributory. ? ?Prior to Admission medications   ?Not on File  ? ? ?Physical Exam: ?Vitals:  ? 03/08/22 1555  ?BP: (!) 157/63  ?Pulse: 70  ?Resp: 17  ?Temp: 98 ?F (36.7 ?C)  ?SpO2: 100%  ? ?General: 76 y.o. female resting in bed in NAD ?Eyes: PERRL, normal sclera ?ENMT: Nares patent w/o discharge, orophaynx clear, dentition normal, ears w/o discharge/lesions/ulcers ?Neck: Supple, trachea midline ?Cardiovascular: RRR, +S1, S2, no m/g/r, equal pulses throughout ?Respiratory: CTABL, no w/r/r, normal WOB ?GI: BS+, ND, mild TTP RUQ, no masses  noted, no organomegaly noted ?MSK: No e/c/c ?Neuro: A&O x 3, no focal deficits ?Psyc: Appropriate interaction and affect, calm/cooperative ? ?Data Reviewed: ? ?Alk phos 222 ?AST 151 ?ALT 78 ?T bili 1.5 ?Hgb 9.4 ? ?MR abdomen w/ w/o at [facility] ?IMPRESSION: ?1. Focal filling defect of the distal common bile duct measuring 6 ?mm, compatible with choledocholithiasis. ?2. Prior cholecystectomy. Rim enhancing heterogeneous collection of the gallbladder fossa containing locules of air, compatible with ?postoperative abscess.  ? ?Assessment and Plan: ?No notes have been filed under this hospital service. ?Service: Hospitalist ?Choledocholithiasis w/ obstruction ??Perioperative abscess ?Elevated LFTs ?    - admit to inpt, med-surg ?    - case discussed w/ [facility]; ERCP tomorrow ?    - NPOpMN ?    - continue zosyn ? ?HTN ?    - resume home regimen when confirmed ? ?DM2 ?    - A1c, SSI, glucose checks, CLD ? ?GERD ?    - PPI ? ?Macrocytic anemia ?    - check iron studies, B12, folate ? ? Advance Care Planning:   Code Status: FULL ? ?Consults: [facility] ? ?Family Communication: w/ granddtr at bedside ? ?Severity of Illness: ?The appropriate patient status for this patient is INPATIENT. Inpatient status is judged to be reasonable and necessary in order to provide the required intensity of service to ensure the patient's safety. The patient's presenting symptoms, physical exam findings, and initial radiographic and laboratory data in the context of their chronic comorbidities is felt to place them at high risk for further clinical deterioration.  Furthermore, it is not anticipated that the patient will be medically stable for discharge from the hospital within 2 midnights of admission.  ? ?* I certify that at the point of admission it is my clinical judgment that the patient will require inpatient hospital care spanning beyond 2 midnights from the point of admission due to high intensity of service, high risk for further  deterioration and high frequency of surveillance required.* ? ?Author: ?Jonnie Finner, DO ?03/08/2022 4:38 PM ? ?For on call review [email or web address].  ?

## 2022-03-09 ENCOUNTER — Inpatient Hospital Stay (HOSPITAL_COMMUNITY): Payer: Medicare HMO | Admitting: Certified Registered"

## 2022-03-09 ENCOUNTER — Encounter (HOSPITAL_COMMUNITY)
Admission: AD | Disposition: A | Discharge status: Home / Self Care | Payer: Self-pay | Source: Other Acute Inpatient Hospital | Attending: Internal Medicine

## 2022-03-09 ENCOUNTER — Inpatient Hospital Stay (HOSPITAL_COMMUNITY): Payer: Medicare HMO

## 2022-03-09 ENCOUNTER — Encounter (HOSPITAL_COMMUNITY): Payer: Self-pay | Admitting: Internal Medicine

## 2022-03-09 DIAGNOSIS — R7989 Other specified abnormal findings of blood chemistry: Secondary | ICD-10-CM

## 2022-03-09 DIAGNOSIS — K838 Other specified diseases of biliary tract: Secondary | ICD-10-CM

## 2022-03-09 DIAGNOSIS — K219 Gastro-esophageal reflux disease without esophagitis: Secondary | ICD-10-CM

## 2022-03-09 DIAGNOSIS — D539 Nutritional anemia, unspecified: Secondary | ICD-10-CM

## 2022-03-09 DIAGNOSIS — I10 Essential (primary) hypertension: Secondary | ICD-10-CM

## 2022-03-09 DIAGNOSIS — K8050 Calculus of bile duct without cholangitis or cholecystitis without obstruction: Secondary | ICD-10-CM

## 2022-03-09 DIAGNOSIS — E1169 Type 2 diabetes mellitus with other specified complication: Secondary | ICD-10-CM

## 2022-03-09 DIAGNOSIS — E119 Type 2 diabetes mellitus without complications: Secondary | ICD-10-CM

## 2022-03-09 HISTORY — PX: REMOVAL OF STONES: SHX5545

## 2022-03-09 HISTORY — PX: SPHINCTEROTOMY: SHX5544

## 2022-03-09 HISTORY — PX: ERCP: SHX5425

## 2022-03-09 LAB — COMPREHENSIVE METABOLIC PANEL
ALT: 70 U/L — ABNORMAL HIGH (ref 0–44)
AST: 123 U/L — ABNORMAL HIGH (ref 15–41)
Albumin: 2.5 g/dL — ABNORMAL LOW (ref 3.5–5.0)
Alkaline Phosphatase: 214 U/L — ABNORMAL HIGH (ref 38–126)
Anion gap: 6 (ref 5–15)
BUN: 11 mg/dL (ref 8–23)
CO2: 32 mmol/L (ref 22–32)
Calcium: 8.5 mg/dL — ABNORMAL LOW (ref 8.9–10.3)
Chloride: 100 mmol/L (ref 98–111)
Creatinine, Ser: 0.81 mg/dL (ref 0.44–1.00)
GFR, Estimated: >60 mL/min (ref >60)
Glucose, Bld: 99 mg/dL (ref 70–99)
Potassium: 3.6 mmol/L (ref 3.5–5.1)
Sodium: 138 mmol/L (ref 135–145)
Total Bilirubin: 1.7 mg/dL — ABNORMAL HIGH (ref 0.3–1.2)
Total Protein: 8 g/dL (ref 6.5–8.1)

## 2022-03-09 LAB — CBC
HCT: 29.8 % — ABNORMAL LOW (ref 36.0–46.0)
Hemoglobin: 9.5 g/dL — ABNORMAL LOW (ref 12.0–15.0)
MCH: 31.7 pg (ref 26.0–34.0)
MCHC: 31.9 g/dL (ref 30.0–36.0)
MCV: 99.3 fL (ref 80.0–100.0)
Platelets: 228 10*3/uL (ref 150–400)
RBC: 3 MIL/uL — ABNORMAL LOW (ref 3.87–5.11)
RDW: 14.3 % (ref 11.5–15.5)
WBC: 7.3 10*3/uL (ref 4.0–10.5)
nRBC: 0 % (ref 0.0–0.2)

## 2022-03-09 LAB — GLUCOSE, CAPILLARY
Glucose-Capillary: 93 mg/dL (ref 70–99)
Glucose-Capillary: 108 mg/dL — ABNORMAL HIGH (ref 70–99)
Glucose-Capillary: 114 mg/dL — ABNORMAL HIGH (ref 70–99)
Glucose-Capillary: 221 mg/dL — ABNORMAL HIGH (ref 70–99)
Glucose-Capillary: 153 mg/dL — ABNORMAL HIGH (ref 70–99)

## 2022-03-09 SURGERY — ERCP, WITH INTERVENTION IF INDICATED
Anesthesia: General

## 2022-03-09 MED ORDER — PROPOFOL 10 MG/ML IV BOLUS
INTRAVENOUS | Status: AC
  Filled 2022-03-09: qty 20

## 2022-03-09 MED ORDER — ONDANSETRON HCL 4 MG/2ML IJ SOLN
INTRAMUSCULAR | Status: DC | PRN
  Administered 2022-03-09: 4 mg via INTRAVENOUS

## 2022-03-09 MED ORDER — SUGAMMADEX SODIUM 500 MG/5ML IV SOLN
INTRAVENOUS | Status: DC | PRN
  Administered 2022-03-09: 400 mg via INTRAVENOUS

## 2022-03-09 MED ORDER — EPHEDRINE SULFATE-NACL 50-0.9 MG/10ML-% IV SOSY
PREFILLED_SYRINGE | INTRAVENOUS | Status: DC | PRN
  Administered 2022-03-09: 10 mg via INTRAVENOUS

## 2022-03-09 MED ORDER — SODIUM CHLORIDE 0.9 % IV SOLN
INTRAVENOUS | Status: DC | PRN
  Administered 2022-03-09: 20 mL

## 2022-03-09 MED ORDER — MIDAZOLAM HCL 2 MG/2ML IJ SOLN
INTRAMUSCULAR | Status: DC | PRN
  Administered 2022-03-09: 1 mg via INTRAVENOUS

## 2022-03-09 MED ORDER — DEXAMETHASONE SODIUM PHOSPHATE 10 MG/ML IJ SOLN
INTRAMUSCULAR | Status: DC | PRN
  Administered 2022-03-09: 5 mg via INTRAVENOUS

## 2022-03-09 MED ORDER — MIDAZOLAM HCL 2 MG/2ML IJ SOLN
INTRAMUSCULAR | Status: AC
  Filled 2022-03-09: qty 2

## 2022-03-09 MED ORDER — FENTANYL CITRATE (PF) 100 MCG/2ML IJ SOLN
INTRAMUSCULAR | Status: DC | PRN
  Administered 2022-03-09: 50 ug via INTRAVENOUS

## 2022-03-09 MED ORDER — INDOMETHACIN 50 MG RE SUPP
RECTAL | Status: AC
  Filled 2022-03-09: qty 2

## 2022-03-09 MED ORDER — LACTATED RINGERS IV SOLN: INTRAVENOUS | Status: DC | PRN

## 2022-03-09 MED ORDER — GLUCAGON HCL RDNA (DIAGNOSTIC) 1 MG IJ SOLR
INTRAMUSCULAR | Status: AC
  Filled 2022-03-09: qty 1

## 2022-03-09 MED ORDER — PHENYLEPHRINE HCL (PRESSORS) 10 MG/ML IV SOLN
INTRAVENOUS | Status: AC
  Filled 2022-03-09: qty 1

## 2022-03-09 MED ORDER — ROCURONIUM BROMIDE 10 MG/ML (PF) SYRINGE
PREFILLED_SYRINGE | INTRAVENOUS | Status: DC | PRN
  Administered 2022-03-09: 80 mg via INTRAVENOUS

## 2022-03-09 MED ORDER — FENTANYL CITRATE (PF) 100 MCG/2ML IJ SOLN
INTRAMUSCULAR | Status: AC
  Filled 2022-03-09: qty 2

## 2022-03-09 MED ORDER — PHENYLEPHRINE HCL-NACL 20-0.9 MG/250ML-% IV SOLN
INTRAVENOUS | Status: DC | PRN
  Administered 2022-03-09: 50 ug/min via INTRAVENOUS

## 2022-03-09 MED ORDER — LIDOCAINE 2% (20 MG/ML) 5 ML SYRINGE
INTRAMUSCULAR | Status: DC | PRN
Start: 2022-03-09 — End: 2022-03-09
  Administered 2022-03-09: 100 mg via INTRAVENOUS

## 2022-03-09 MED ORDER — PROPOFOL 10 MG/ML IV BOLUS
INTRAVENOUS | Status: DC | PRN
  Administered 2022-03-09: 100 mg via INTRAVENOUS
  Administered 2022-03-09: 50 mg via INTRAVENOUS

## 2022-03-09 NOTE — Op Note (Signed)
[facility] ?Patient Name: Tricia Butler ?Procedure Date: 03/09/2022 ?MRN: [id number] ?Attending MD: Clarene Essex , MD ?Date of Birth: July 16, 1946 ?CSN: [id number] ?Age: 76 ?Admit Type: Inpatient ?Procedure:                ERCP ?Indications:              Bile duct stone(s) on MRCP in patient with resolved  ?                          abdominal pain and increased liver test status  ?                          postcholecystectomy ?Providers:                Clarene Essex, MD, Jeanella Cara, RN, Charlean Merl  ?                          Purcell Nails, Technician ?Referring MD:              ?Medicines:                General Anesthesia ?Complications:            No immediate complications. ?Estimated Blood Loss:     Estimated blood loss was minimal. ?Procedure:                Pre-Anesthesia Assessment: ?                          - Prior to the procedure, a History and Physical  ?                          was performed, and patient medications and  ?                          allergies were reviewed. The patient's tolerance of  ?                          previous anesthesia was also reviewed. The risks  ?                          and benefits of the procedure and the sedation  ?                          options and risks were discussed with the patient.  ?                          All questions were answered, and informed consent  ?                          was obtained. Prior Anticoagulants: The patient has  ?                          taken Lovenox (enoxaparin), last dose was 1 day  ?                          prior to  procedure. ASA Grade Assessment: III - A  ?                          patient with severe systemic disease. After  ?                          reviewing the risks and benefits, the patient was  ?                          deemed in satisfactory condition to undergo the  ?                          procedure. ?                          After obtaining informed consent, the scope was  ?                           passed under direct vision. Throughout the  ?                          procedure, the patient's blood pressure, pulse, and  ?                          oxygen saturations were monitored continuously. The  ?                          [id number]-[id number] ([id number]) Olympus duodenoscope was  ?                          introduced through the mouth, and used to inject  ?                          contrast into and used to cannulate the bile duct.  ?                          The ERCP was accomplished without difficulty. The  ?                          patient tolerated the procedure well. ?Scope In: ?Scope Out: ?Findings: ?     The major papilla was slightly bulging. Deep selective cannulation was  ?     readily obtained on the first attempt and an obvious stone was seen on  ?     initial cholangiogram and we proceeded with a biliary sphincterotomy  ?     which was made with a Hydratome sphincterotome using ERBE  ?     electrocautery. We proceeded until we had adequate biliary drainage and  ?     could get the fully bowed sphincterotome easily in and out of the duct  ?     and there was self limited oozing not from the sphincterotomy but from  ?     pulling the stone in the balloon through the patent sphincterotomy site  ?     which did not require treatment. Choledocholithiasis was found in a  ?  mildly dilated duct. The common bile duct contained one stone, which was  ?     medium-sized in diameter. To discover objects, the biliary tree was  ?     swept with an adjustable 12- 15 mm balloon starting at the bifurcation.  ?     All stones were removed. Nothing was found on multiple balloon  ?     pull-through's after the stone was removed on the first attempt and both  ?     size balloons were used and passed readily through the patent  ?     sphincterotomy site without resistance and there was no pancreatic duct  ?     injection or wire advancement throughout the procedure and there was no  ?     residual stone seen on occlusion  cholangiogram done in the customary  ?     fashion and there was no bleeding at the end of the procedure and there  ?     was sluggish but adequate biliary drainage probably from a little  ?     ampullary edema and the wire and the balloon catheter were removed and  ?     the scope was removed and the patient tolerated the procedure well. ?Impression:               - The major papilla appeared to be slightly bulging. ?                          - Choledocholithiasis was found. Complete removal  ?                          was accomplished by biliary sphincterotomy and  ?                          balloon extraction. ?                          - A biliary sphincterotomy was performed. ?                          - The biliary tree was swept and nothing was found  ?                          at the end of the procedure. ?Moderate Sedation: ?     Not Applicable - Patient had care per Anesthesia. ?Recommendation:           - Clear liquid diet for 6 hours. If doing well at 3  ?                          PM may have soft solids and if tolerating diet and  ?                          okay with hospital team can go home this evening ?                          - Continue present medications. ?                          -  Return to GI clinic PRN. ?                          - Telephone GI clinic if symptomatic PRN. ?                          - Check liver enzymes (AST, ALT, alkaline  ?                          phosphatase, bilirubin) in the morning or if she  ?                          goes home have primary care team or surgical team  ?                          repeat liver tests as outpatient and follow back to  ?                          normal. ?Procedure Code(s):        --- Professional --- ?                          61950, Endoscopic retrograde  ?                          cholangiopancreatography (ERCP); with removal of  ?                          calculi/debris from biliary/pancreatic duct(s) ?                          [id number],  Endoscopic retrograde  ?                          cholangiopancreatography (ERCP); with  ?                          sphincterotomy/papillotomy ?Diagnosis Code(s):        --- Professional --- ?                          K80.50, Calculus of bile duct without cholangitis  ?                          or cholecystitis without obstruction ?                          K83.8, Other specified diseases of biliary tract ?CPT copyright 2019 American Medical Association. All rights reserved. ?The codes documented in this report are preliminary and upon coder review may  ?be revised to meet current compliance requirements. ?Clarene Essex, MD ?03/09/2022 9:15:20 AM ?This report has been signed electronically. ?Number of Addenda: 0 ?

## 2022-03-09 NOTE — Anesthesia Postprocedure Evaluation (Signed)
Anesthesia Post Note ? ?Patient: Brookelyn Gaynor ? ?Procedure(s) Performed: ENDOSCOPIC RETROGRADE CHOLANGIOPANCREATOGRAPHY (ERCP) ?SPHINCTEROTOMY ?REMOVAL OF STONES ? ?  ? ?Patient location during evaluation: Endoscopy ?Anesthesia Type: General ?Level of consciousness: awake and alert ?Pain management: pain level controlled ?Vital Signs Assessment: post-procedure vital signs reviewed and stable ?Respiratory status: spontaneous breathing, nonlabored ventilation and respiratory function stable ?Cardiovascular status: blood pressure returned to baseline and stable ?Postop Assessment: no apparent nausea or vomiting ?Anesthetic complications: no ? ? ?No notable events documented. ? ?Last Vitals:  ?Vitals:  ? 03/09/22 0725 03/09/22 0925  ?BP:  136/64  ?Pulse:  87  ?Resp:  16  ?Temp:  37.1 ?C  ?SpO2: 94%   ?  ?Last Pain:  ?Vitals:  ? 03/09/22 0925  ?TempSrc: Temporal  ?PainSc: 0-No pain  ? ? ?  ?  ?  ?  ?  ?  ? ?Lidia Collum ? ? ? ? ?

## 2022-03-09 NOTE — Anesthesia Procedure Notes (Signed)
Date/Time: 03/09/2022 9:15 AM ?Performed by: Cynda Familia, CRNA ?Oxygen Delivery Method: Simple face mask ?Placement Confirmation: positive ETCO2 and breath sounds checked- equal and bilateral ?Dental Injury: Teeth and Oropharynx as per pre-operative assessment  ? ? ? ? ?

## 2022-03-09 NOTE — Progress Notes (Signed)
?PROGRESS NOTE ? ?[name] SWF:[id number] DOB: 1945-12-06 DOA: 03/08/2022 ?PCP: System, Provider Not In ? ?Brief History   ?Tricia Butler is a 76 y.o. female with medical history significant of DM2, GERD, HTN. Presenting with abdominal pain. She had a recent cholecystectomy (02/19/22). Apparently a cholangiogram was performed and there were retained stones identified. The patient presented to [facility] 2 days ago with abdominal pain. She reports it was sudden onset. It was located in the RUQ. It was sharp. She didn't have any fever, nausea, or vomiting. Work up, including an MRCP, revealed CBD dilation w/ concern for an obstructing stone. [facility] hospital requested transfer to [facility] d/t not having GI services available. ? ?The patient is seen prior to her ERCP. ? ?Consultants  ?Gastroenterology ? ?Procedures  ?ERCP ? ?Antibiotics  ? ?Anti-infectives (From admission, onward)  ? ? Start     Dose/Rate Route Frequency Ordered Stop  ? 03/09/22 0600  piperacillin-tazobactam (ZOSYN) IVPB 3.375 g       ? 3.375 g ?12.5 mL/hr over 240 Minutes Intravenous Every 8 hours 03/08/22 1901    ? ?  ? ?Subjective  ?The patient is resting comfortably. No new complaints.  ? ?Objective  ? ?Vitals:  ?Vitals:  ? 03/09/22 1000 03/09/22 1308  ?BP:  (!) 163/73  ?Pulse:  91  ?Resp:  15  ?Temp:  98.5 ?F (36.9 ?C)  ?SpO2: 92% (!) 77%  ? ? ?Exam: ? ?Constitutional:  ?The patient is awake, alert, and oriented x 3. No acute distress. ?Respiratory:  ?No increased work of breathing. ?No wheezes, rales, or rhonchi ?No tactile fremitus ?Cardiovascular:  ?Regular rate and rhythm ?No murmurs, ectopy, or gallups. ?No lateral PMI. No thrills. ?Abdomen:  ?Abdomen is soft, slightly distended, and diffusely tender ?No hernias, masses, or organomegaly ?Normoactive bowel sounds.  ?Musculoskeletal:  ?No cyanosis, clubbing, or edema ?Skin:  ?No rashes, lesions, ulcers ?palpation of skin: no induration or nodules ?Neurologic:  ?CN 2-12  intact ?Sensation all 4 extremities intact ?Psychiatric:  ?Mental status ?Mood, affect appropriate ?Orientation to person, place, time  ?judgment and insight appear intact ? ? ?I have personally reviewed the following:  ? ?Today's Data  ?Vitals ? ?Lab Data  ?CMP ?CBC ? ?Micro Data  ?Blood cultures x 2 is pending. ? ?Imaging  ? ? ?Cardiology Data  ? ? ?Other Data  ? ? ?Scheduled Meds: ? enoxaparin (LOVENOX) injection  40 mg Subcutaneous Q24H  ? insulin aspart  0-9 Units Subcutaneous TID WC  ? pantoprazole  40 mg Oral Daily  ? ?Continuous Infusions: ? piperacillin-tazobactam (ZOSYN)  IV 3.375 g (03/09/22 1448)  ? ? ?Principal Problem: ?  Choledocholithiasis ?Active Problems: ?  HTN (hypertension) ?  Elevated LFTs ?  DM2 (diabetes mellitus, type 2) ([place]) ?  GERD (gastroesophageal reflux disease) ?  Macrocytic anemia ? ? LOS: 1 day  ? ?A & P  ?Choledocholithiasis/Elevated LFT's: Pt is to undergo ERCP with GI later today. Will draw blood cultures x 2. Patient is receiving IV Zosyn. Monitor LFT's. ? ?Macrocytic anemia: Likely due to chronic inflammation. ? ?DM II: Glucoses are controlled with Actos and Semaglutide as outpatient. As inpatient the patient's glucoses are being managed with FSBS and SSI. ? ?Hypertension: Blood pressures have been running high, although the patient has not received her usual home meds. These will be restarted following her procedure. ? ?GERD: Continue PPI as at home. ? ?I have seen and examined this patient myself. I have spent 34 minutes in her evaluation  and care. ? ?DVT prophylaxis: Lovenox ?Code Status: Full Code ?Family Communication: None available ?Disposition Plan: Home  ? ? ?Ahtziri Jeffries, DO ?[facility] ?Direct contact: see [email or web address]  ?7PM-7AM contact night coverage as above ?03/09/2022, 6:17 PM  LOS: 1 day  ? ? ? ? ? ?  ?

## 2022-03-09 NOTE — Consult Note (Addendum)
Reason for Consult: CBD stones on MRCP ?Referring Physician: Hospital team ? ?Tricia Butler is an 76 y.o. female.  ?HPI: Patient seen and examined and discussed with hospital team and MR CP report read to me although I cannot review the actual pictures since done at an outlying hospital and her history was reviewed and she is currently doing better and has no new complaints ? ?Past Medical History:  ?Diagnosis Date  ? Diabetes mellitus without complication ([place])   ? GERD (gastroesophageal reflux disease)   ? Hypertension   ? ? ?Past Surgical History:  ?Procedure Laterality Date  ? ANKLE FRACTURE SURGERY    ? ? ?History reviewed. No pertinent family history. ? ?Social History:  reports that she has never smoked. She has never used smokeless tobacco. She reports that she does not drink alcohol and does not use drugs. ? ?Allergies: No Known Allergies ? ?Medications: I have reviewed the patient's current medications. ? ?Results for orders placed or performed during the hospital encounter of 03/08/22 (from the past 48 hour(s))  ?CBC with Differential/Platelet     Status: Abnormal  ? Collection Time: 03/08/22  5:06 PM  ?Result Value Ref Range  ? WBC 6.9 4.0 - 10.5 K/uL  ? RBC 2.99 (L) 3.87 - 5.11 MIL/uL  ? Hemoglobin 9.4 (L) 12.0 - 15.0 g/dL  ? HCT 30.0 (L) 36.0 - 46.0 %  ? MCV 100.3 (H) 80.0 - 100.0 fL  ? MCH 31.4 26.0 - 34.0 pg  ? MCHC 31.3 30.0 - 36.0 g/dL  ? RDW 14.3 11.5 - 15.5 %  ? Platelets 214 150 - 400 K/uL  ? nRBC 0.0 0.0 - 0.2 %  ? Neutrophils Relative % 57 %  ? Neutro Abs 3.9 1.7 - 7.7 K/uL  ? Lymphocytes Relative 31 %  ? Lymphs Abs 2.1 0.7 - 4.0 K/uL  ? Monocytes Relative 10 %  ? Monocytes Absolute 0.7 0.1 - 1.0 K/uL  ? Eosinophils Relative 1 %  ? Eosinophils Absolute 0.1 0.0 - 0.5 K/uL  ? Basophils Relative 1 %  ? Basophils Absolute 0.1 0.0 - 0.1 K/uL  ? Immature Granulocytes 0 %  ? Abs Immature Granulocytes 0.01 0.00 - 0.07 K/uL  ?  Comment: Performed at [facility], [place] [address] [place], [place] [address]  ?Comprehensive metabolic panel     Status: Abnormal  ? Collection Time: 03/08/22  5:06 PM  ?Result Value Ref Range  ? Sodium 139 135 - 145 mmol/L  ? Potassium 4.1 3.5 - 5.1 mmol/L  ? Chloride 99 98 - 111 mmol/L  ? CO2 33 (H) 22 - 32 mmol/L  ? Glucose, Bld 104 (H) 70 - 99 mg/dL  ?  Comment: Glucose reference range applies only to samples taken after fasting for at least 8 hours.  ? BUN 13 8 - 23 mg/dL  ? Creatinine, Ser 0.85 0.44 - 1.00 mg/dL  ? Calcium 8.5 (L) 8.9 - 10.3 mg/dL  ? Total Protein 8.1 6.5 - 8.1 g/dL  ? Albumin 2.5 (L) 3.5 - 5.0 g/dL  ? AST 151 (H) 15 - 41 U/L  ? ALT 78 (H) 0 - 44 U/L  ? Alkaline Phosphatase 222 (H) 38 - 126 U/L  ? Total Bilirubin 1.5 (H) 0.3 - 1.2 mg/dL  ? GFR, Estimated >60 >60 mL/min  ?  Comment: (NOTE) ?Calculated using the CKD-EPI Creatinine Equation (2021) ?  ? Anion gap 7 5 - 15  ?  Comment: Performed at [facility], [place] [address], [place], [place] [address]  ?Hemoglobin A1c     Status: Abnormal  ? Collection Time: 03/08/22  5:06 PM  ?Result Value Ref Range  ? Hgb A1c MFr Bld 6.2 (H) 4.8 - 5.6 %  ?  Comment: (NOTE) ?Pre diabetes:          5.7%-6.4% ? ?Diabetes:              >6.4% ? ?Glycemic control for   <7.0% ?adults with diabetes ?  ? Mean Plasma Glucose 131.24 mg/dL  ?  Comment: Performed at [facility], [place] [address], [place], [place] [address]  ?Vitamin B12     Status: Abnormal  ? Collection Time: 03/08/22  6:22 PM  ?Result Value Ref Range  ? Vitamin B-12 1,405 (H) 180 - 914 pg/mL  ?  Comment: (NOTE) ?This assay is not validated for testing neonatal or ?myeloproliferative syndrome specimens for Vitamin B12 levels. ?Performed at [facility], [place] [address] ?[place], [place] [address] ?  ?Folate, serum, performed at [facility] lab     Status: None  ? Collection Time: 03/08/22  6:22 PM  ?Result Value Ref Range  ? Folate 37.1 >5.9 ng/mL  ?  Comment: RESULTS CONFIRMED BY MANUAL DILUTION ?Performed at  [facility], [place] [address] [place], [place] [address] ?  ?Iron and TIBC     Status: Abnormal  ? Collection Time: 03/08/22  6:22 PM  ?Result Value Ref Range  ? Iron 42 28 - 170 ug/dL  ? TIBC 236 (L) 250 - 450 ug/dL  ? Saturation Ratios 18 10.4 - 31.8 %  ? UIBC 194 ug/dL  ?  Comment: Performed at [facility], [place] [address], [place], [place] [address]  ?Comprehensive metabolic panel     Status: Abnormal  ? Collection Time: 03/09/22  1:10 AM  ?Result Value Ref Range  ? Sodium 138 135 - 145 mmol/L  ? Potassium 3.6 3.5 - 5.1 mmol/L  ? Chloride 100 98 - 111 mmol/L  ? CO2 32 22 - 32 mmol/L  ? Glucose, Bld 99 70 - 99 mg/dL  ?  Comment: Glucose reference range applies only to samples taken after fasting for at least 8 hours.  ? BUN 11 8 - 23 mg/dL  ? Creatinine, Ser 0.81 0.44 - 1.00 mg/dL  ? Calcium 8.5 (L) 8.9 - 10.3 mg/dL  ? Total Protein 8.0 6.5 - 8.1 g/dL  ? Albumin 2.5 (L) 3.5 - 5.0 g/dL  ? AST 123 (H) 15 - 41 U/L  ? ALT 70 (H) 0 - 44 U/L  ? Alkaline Phosphatase 214 (H) 38 - 126 U/L  ? Total Bilirubin 1.7 (H) 0.3 - 1.2 mg/dL  ? GFR, Estimated >60 >60 mL/min  ?  Comment: (NOTE) ?Calculated using the CKD-EPI Creatinine Equation (2021) ?  ? Anion gap 6 5 - 15  ?  Comment: Performed at [facility], [place] [address], [place], [place] [address]  ?CBC     Status: Abnormal  ? Collection Time: 03/09/22  1:10 AM  ?Result Value Ref Range  ? WBC 7.3 4.0 - 10.5 K/uL  ? RBC 3.00 (L) 3.87 - 5.11 MIL/uL  ? Hemoglobin 9.5 (L) 12.0 - 15.0 g/dL  ? HCT 29.8 (L) 36.0 - 46.0 %  ? MCV 99.3 80.0 - 100.0 fL  ? MCH 31.7 26.0 - 34.0 pg  ? MCHC 31.9 30.0 - 36.0 g/dL  ? RDW 14.3  11.5 - 15.5 %  ? Platelets 228 150 - 400 K/uL  ? nRBC 0.0 0.0 - 0.2 %  ?  Comment: Performed at [facility], [place] [address], [place], [place] [address]  ?Glucose, capillary     Status: None  ? Collection Time: 03/09/22  7:35 AM  ?Result Value Ref Range  ? Glucose-Capillary 93 70 - 99 mg/dL  ?   Comment: Glucose reference range applies only to samples taken after fasting for at least 8 hours.  ? ? ?No results found. ? ?ROS negative except above ?Blood pressure 136/64, pulse 83, temperature 98.5 ?F (36.9 ?C), temperature source Temporal, resp. rate 17, height '5\' 3"'$  (1.6 m), weight 101.1 kg, SpO2 94 %. ?Physical Exam vital signs stable afebrile no acute distress exam please see preassessment evaluation labs stable improved from previous hospital as reported to me ? ?Assessment/Plan: ?Positive MRCP for CBD stones ?Plan: The risk benefits methods and success rate of ERCP and stone extraction was discussed with the patient and will proceed today with further work-up and plans pending those findings ? ?[facility] E ?03/09/2022, 8:11 AM  ? ? ? ? ?

## 2022-03-09 NOTE — Anesthesia Preprocedure Evaluation (Signed)
Anesthesia Evaluation  ?Patient identified by MRN, date of birth, ID band ?Patient awake ? ? ? ?Reviewed: ?Allergy & Precautions, NPO status , Patient's Chart, lab work & pertinent test results ? ?History of Anesthesia Complications ?Negative for: history of anesthetic complications ? ?Airway ?Mallampati: II ? ?TM Distance: >3 FB ?Neck ROM: Full ? ? ? Dental ?  ?Pulmonary ?neg pulmonary ROS,  ?  ?Pulmonary exam normal ? ? ? ? ? ? ? Cardiovascular ?hypertension, Normal cardiovascular exam ? ? ?Echo 02/18/22: EF 60-65%, impaired relaxation, no RWMA, normal RVSF, mild AV sclerosis w/o stenosis, trace MR, mild TR ?  ?Neuro/Psych ?negative neurological ROS ?   ? GI/Hepatic ?Neg liver ROS, GERD  ,Choledocholithiasis, s/p lap chole 3/28 ?  ?Endo/Other  ?diabetes ? Renal/GU ?negative Renal ROS  ?negative genitourinary ?  ?Musculoskeletal ?negative musculoskeletal ROS ?(+)  ? Abdominal ?  ?Peds ? Hematology ? ?(+) Blood dyscrasia, anemia ,   ?Anesthesia Other Findings ? ? Reproductive/Obstetrics ? ?  ? ? ? ? ? ? ? ? ? ? ? ? ? ?  ?  ? ? ? ? ? ? ? ? ?Anesthesia Physical ?Anesthesia Plan ? ?ASA: 2 and emergent ? ?Anesthesia Plan: General  ? ?Post-op Pain Management: Minimal or no pain anticipated  ? ?Induction: Intravenous ? ?PONV Risk Score and Plan: 3 and Ondansetron, Dexamethasone, Treatment may vary due to age or medical condition and Midazolam ? ?Airway Management Planned: Oral ETT ? ?Additional Equipment: None ? ?Intra-op Plan:  ? ?Post-operative Plan: Extubation in OR ? ?Informed Consent: I have reviewed the patients History and Physical, chart, labs and discussed the procedure including the risks, benefits and alternatives for the proposed anesthesia with the patient or authorized representative who has indicated his/her understanding and acceptance.  ? ? ? ?Dental advisory given ? ?Plan Discussed with:  ? ?Anesthesia Plan Comments:   ? ? ? ? ? ? ?Anesthesia Quick Evaluation ? ?

## 2022-03-09 NOTE — Anesthesia Procedure Notes (Signed)
Procedure Name: Intubation ?Date/Time: 03/09/2022 8:20 AM ?Performed by: Cynda Familia, CRNA ?Pre-anesthesia Checklist: Patient identified, Emergency Drugs available, Suction available and Patient being monitored ?Patient Re-evaluated:Patient Re-evaluated prior to induction ?Oxygen Delivery Method: Circle System Utilized ?Preoxygenation: Pre-oxygenation with 100% oxygen ?Induction Type: IV induction ?Ventilation: Mask ventilation without difficulty and Mask ventilation with difficulty ?Laryngoscope Size: Sabra Heck and 2 ?Grade View: Grade I ?Tube type: Oral ?Number of attempts: 1 ?Airway Equipment and Method: Stylet ?Placement Confirmation: ETT inserted through vocal cords under direct vision, positive ETCO2 and breath sounds checked- equal and bilateral ?Secured at: 22 cm ?Tube secured with: Tape ?Dental Injury: Teeth and Oropharynx as per pre-operative assessment  ?Comments: IV induction [place]- intubation AM CRNA atraumatic-- pt reported very poor dentition preop-- many broken and missing teeth -- teeth and mouth unchanged with intubation-- bite block by GI RN- bilat BS ? ? ? ? ?

## 2022-03-09 NOTE — Progress Notes (Signed)
As an addendum I discussed her case with her daughter on the phone and we were able to find her CT and MRI from the outlying hospital and there is a question of a fluid collection or questionable abscess in the gallbladder fossa and I would recommend calling radiology and seeing if they can drain that or if we should continue antibiotics and repeat imaging in a few weeks to see if it resolves and I will leave that up to the hospital team and radiology to decide and if still a question consider consulting surgery for their opinion ?

## 2022-03-09 NOTE — Transfer of Care (Signed)
Immediate Anesthesia Transfer of Care Note ? ?Patient: Tricia Butler ? ?Procedure(s) Performed: ENDOSCOPIC RETROGRADE CHOLANGIOPANCREATOGRAPHY (ERCP) ?SPHINCTEROTOMY ?REMOVAL OF STONES ? ?Patient Location: PACU and Endoscopy Unit ? ?Anesthesia Type:General ? ?Level of Consciousness: awake and alert  ? ?Airway & Oxygen Therapy: Patient Spontanous Breathing and Patient connected to face mask oxygen ? ?Post-op Assessment: Report given to RN and Post -op Vital signs reviewed and stable ? ?Post vital signs: Reviewed and stable ? ?Last Vitals:  ?Vitals Value Taken Time  ?BP    ?Temp    ?Pulse    ?Resp    ?SpO2    ? ? ?Last Pain:  ?Vitals:  ? 03/09/22 0721  ?TempSrc: Temporal  ?PainSc: 0-No pain  ?   ? ?  ? ?Complications: No notable events documented. ?

## 2022-03-10 ENCOUNTER — Encounter (HOSPITAL_COMMUNITY): Payer: Self-pay | Admitting: Internal Medicine

## 2022-03-10 LAB — COMPREHENSIVE METABOLIC PANEL
ALT: 51 U/L — ABNORMAL HIGH (ref 0–44)
AST: 76 U/L — ABNORMAL HIGH (ref 15–41)
Albumin: 2.4 g/dL — ABNORMAL LOW (ref 3.5–5.0)
Alkaline Phosphatase: 191 U/L — ABNORMAL HIGH (ref 38–126)
Anion gap: 9 (ref 5–15)
BUN: 13 mg/dL (ref 8–23)
CO2: 30 mmol/L (ref 22–32)
Calcium: 8 mg/dL — ABNORMAL LOW (ref 8.9–10.3)
Chloride: 99 mmol/L (ref 98–111)
Creatinine, Ser: 1.01 mg/dL — ABNORMAL HIGH (ref 0.44–1.00)
GFR, Estimated: 57 mL/min — ABNORMAL LOW (ref >60)
Glucose, Bld: 130 mg/dL — ABNORMAL HIGH (ref 70–99)
Potassium: 4.3 mmol/L (ref 3.5–5.1)
Sodium: 138 mmol/L (ref 135–145)
Total Bilirubin: 1 mg/dL (ref 0.3–1.2)
Total Protein: 7.7 g/dL (ref 6.5–8.1)

## 2022-03-10 LAB — CBC WITH DIFFERENTIAL/PLATELET
Abs Immature Granulocytes: 0.03 10*3/uL (ref 0.00–0.07)
Basophils Absolute: 0 10*3/uL (ref 0.0–0.1)
Basophils Relative: 0 %
Eosinophils Absolute: 0 10*3/uL (ref 0.0–0.5)
Eosinophils Relative: 0 %
HCT: 28.7 % — ABNORMAL LOW (ref 36.0–46.0)
Hemoglobin: 8.8 g/dL — ABNORMAL LOW (ref 12.0–15.0)
Immature Granulocytes: 1 %
Lymphocytes Relative: 31 %
Lymphs Abs: 1.6 10*3/uL (ref 0.7–4.0)
MCH: 30.8 pg (ref 26.0–34.0)
MCHC: 30.7 g/dL (ref 30.0–36.0)
MCV: 100.3 fL — ABNORMAL HIGH (ref 80.0–100.0)
Monocytes Absolute: 0.4 10*3/uL (ref 0.1–1.0)
Monocytes Relative: 7 %
Neutro Abs: 3.1 10*3/uL (ref 1.7–7.7)
Neutrophils Relative %: 61 %
Platelets: 223 10*3/uL (ref 150–400)
RBC: 2.86 MIL/uL — ABNORMAL LOW (ref 3.87–5.11)
RDW: 13.8 % (ref 11.5–15.5)
WBC: 5.1 10*3/uL (ref 4.0–10.5)
nRBC: 0 % (ref 0.0–0.2)

## 2022-03-10 LAB — GLUCOSE, CAPILLARY
Glucose-Capillary: 114 mg/dL — ABNORMAL HIGH (ref 70–99)
Glucose-Capillary: 115 mg/dL — ABNORMAL HIGH (ref 70–99)
Glucose-Capillary: 119 mg/dL — ABNORMAL HIGH (ref 70–99)
Glucose-Capillary: 129 mg/dL — ABNORMAL HIGH (ref 70–99)

## 2022-03-10 MED ORDER — ENOXAPARIN SODIUM 40 MG/0.4ML IJ SOSY: 40.0000 mg | PREFILLED_SYRINGE | INTRAMUSCULAR | Status: DC

## 2022-03-10 NOTE — H&P (Signed)
Chief Complaint: Patient was seen in consultation today for aspiration of intra-abdominal abscess with possible drain placement at the request of Ava Swayze, DO Referring Physician(s): Fran Lowes, DO  Supervising Physician: Oley Balm  Patient Status: [facility] - In-pt  History of Present Illness: Tricia Butler is a 76 y.o. female with past medical history significant for DM 2, GERD, HTN and microcytic anemia.  Patient presented to to [facility] 03/07/2022 as a transfer from [facility] due to not having GI capability. Patient presented to [facility] 2 days prior complaining of abdominal pain status post cholecystectomy on 02/19/2022.  Patient had imaging at outside facility that noted questionable abscess in gallbladder fossa.  Ava Swayze, DO, has referred patient to IR for aspiration of intra-abdominal abscess with possible drain placement.  Procedure was approved by Ruel Favors, MD.  Procedure is tentatively scheduled for 03/11/2022.  Past Medical History:  Diagnosis Date   Diabetes mellitus without complication ([facility])    GERD (gastroesophageal reflux disease)    Hypertension     Past Surgical History:  Procedure Laterality Date   ANKLE FRACTURE SURGERY      Allergies: Patient has no known allergies.  Medications: Prior to Admission medications   Medication Sig Start Date End Date Taking? Authorizing Provider  albuterol (VENTOLIN HFA) 108 (90 Base) MCG/ACT inhaler Inhale 2 puffs into the lungs every 6 (six) hours as needed for wheezing or shortness of breath.   Yes [provider]  aspirin EC 81 MG tablet Take 81 mg by mouth in the morning. Swallow whole.   Yes [provider]  atorvastatin (LIPITOR) 40 MG tablet Take 40 mg by mouth at bedtime.   Yes [provider]  Cholecalciferol (VITAMIN D-3) 25 MCG (1000 UT) CAPS Take 1,000 Units by mouth in the morning.   Yes [provider]  citalopram (CELEXA) 10 MG tablet Take 10  mg by mouth at bedtime.   Yes [provider]  diazepam (VALIUM) 5 MG tablet Take 5 mg by mouth daily as needed for anxiety.   Yes [provider]  Garlic 1000 MG CAPS Take 1,000 mg by mouth in the morning.   Yes [provider]  hydrochlorothiazide (MICROZIDE) 12.5 MG capsule Take 12.5 mg by mouth in the morning.   Yes [provider]  lisinopril (ZESTRIL) 5 MG tablet Take 5 mg by mouth in the morning.   Yes [provider]  metoprolol tartrate (LOPRESSOR) 100 MG tablet Take 50 mg by mouth in the morning and at bedtime.   Yes [provider]  Multiple Vitamins-Minerals (ONE-A-DAY WOMENS 50+) TABS Take 1 tablet by mouth daily with breakfast.   Yes [provider]  niacin 500 MG tablet Take 500 mg by mouth in the morning.   Yes [provider]  Omega-3 Fatty Acids (FISH OIL) 1200 MG CAPS Take 1,200 mg by mouth in the morning and at bedtime.   Yes [provider]  omeprazole (PRILOSEC) 10 MG capsule Take 10 mg by mouth daily before breakfast.   Yes [provider]  pioglitazone (ACTOS) 30 MG tablet Take 30 mg by mouth in the morning.   Yes [provider]  Semaglutide,0.25 or 0.5MG /DOS, (OZEMPIC, 0.25 OR 0.5 MG/DOSE,) 2 MG/1.5ML SOPN Inject 0.25 mg into the skin every Saturday. Patient not taking: Reported on 03/08/2022    [provider]     History reviewed. No pertinent family history.  Social History   Socioeconomic History   Marital  status: Married    Spouse name: Not on file   Number of children: Not on file   Years of education: Not on file   Highest education level: Not on file  Occupational History   Not on file  Tobacco Use   Smoking status: Never   Smokeless tobacco: Never  Vaping Use   Vaping Use: Never used  Substance and Sexual Activity   Alcohol use: Never   Drug use: Never   Sexual activity: Not Currently  Other Topics Concern   Not on file  Social History  Narrative   Not on file   Social Determinants of Health   Financial Resource Strain: Not on file  Food Insecurity: Not on file  Transportation Needs: Not on file  Physical Activity: Not on file  Stress: Not on file  Social Connections: Not on file    Review of Systems: A 12 point ROS discussed and pertinent positives are indicated in the HPI above.  All other systems are negative.  Review of Systems  Constitutional:  Negative for chills and fever.  HENT:  Negative for nosebleeds.   Eyes:  Negative for visual disturbance.  Respiratory:  Negative for cough and shortness of breath.   Cardiovascular:  Negative for chest pain.  Gastrointestinal:  Negative for abdominal pain, blood in stool, nausea and vomiting.  Genitourinary:  Negative for hematuria.  Neurological:  Positive for headaches. Negative for dizziness.   Vital Signs: BP (!) 158/70 (BP Location: Left Arm)   Pulse 73   Temp (!) 97.3 F (36.3 C) (Oral)   Resp 18   Ht 5\' 3"  (1.6 m)   Wt 222 lb 14.2 oz (101.1 kg)   SpO2 92%   BMI 39.48 kg/m   Physical Exam Constitutional:      General: She is not in acute distress.    Appearance: Normal appearance. She is not ill-appearing.  HENT:     Head: Normocephalic and atraumatic.     Mouth/Throat:     Mouth: Mucous membranes are moist.     Pharynx: Oropharynx is clear.  Eyes:     Extraocular Movements: Extraocular movements intact.     Pupils: Pupils are equal, round, and reactive to light.  Cardiovascular:     Rate and Rhythm: Normal rate and regular rhythm.     Pulses: Normal pulses.     Heart sounds: Normal heart sounds.  Pulmonary:     Effort: Pulmonary effort is normal. No respiratory distress.     Breath sounds: Normal breath sounds.  Abdominal:     General: Bowel sounds are normal. There is no distension.     Tenderness: There is no abdominal tenderness. There is no guarding.  Musculoskeletal:     Right lower leg: Edema present.     Left lower leg: Edema  present.  Skin:    General: Skin is warm and dry.  Neurological:     Mental Status: She is alert and oriented to person, place, and time.  Psychiatric:        Mood and Affect: Mood normal.        Behavior: Behavior normal.        Thought Content: Thought content normal.        Judgment: Judgment normal.    Imaging: DG ERCP  Result Date: 03/09/2022 CLINICAL DATA:  ERCP EXAM: ERCP TECHNIQUE: Multiple spot images obtained with the fluoroscopic device and submitted for interpretation post-procedure. FLUOROSCOPY: Refer to report COMPARISON:  None. FINDINGS: A total of  2 fluoroscopic spot images obtained during ERCP are submitted for review. Images demonstrate a scope overlying the upper abdomen. There is wire catheterization and contrast injection of the common bile duct. Images are not calibrated for measurements. The extrahepatic biliary tree appears normal in contour. The intrahepatic biliary tree is not well visualized. Final images demonstrate a balloon inflated in the distal common bile duct. IMPRESSION: ERCP as described.  Refer to procedure report for full details. These images were submitted for radiologic interpretation only. Please see the procedural report for the amount of contrast and the fluoroscopy time utilized. Electronically Signed   By: Olive Bass M.D.   On: 03/09/2022 12:53    Labs:  CBC: Recent Labs    03/08/22 1706 03/09/22 0110 03/10/22 0430  WBC 6.9 7.3 5.1  HGB 9.4* 9.5* 8.8*  HCT 30.0* 29.8* 28.7*  PLT 214 228 223    COAGS: No results for input(s): INR, APTT in the last 8760 hours.  BMP: Recent Labs    03/08/22 1706 03/09/22 0110 03/10/22 0430  NA 139 138 138  K 4.1 3.6 4.3  CL 99 100 99  CO2 33* 32 30  GLUCOSE 104* 99 130*  BUN 13 11 13   CALCIUM 8.5* 8.5* 8.0*  CREATININE 0.85 0.81 1.01*  GFRNONAA >60 >60 57*    LIVER FUNCTION TESTS: Recent Labs    03/08/22 1706 03/09/22 0110 03/10/22 0430  BILITOT 1.5* 1.7* 1.0  AST 151* 123* 76*   ALT 78* 70* 51*  ALKPHOS 222* 214* 191*  PROT 8.1 8.0 7.7  ALBUMIN 2.5* 2.5* 2.4*    TUMOR MARKERS: No results for input(s): AFPTM, CEA, CA199, CHROMGRNA in the last 8760 hours.  Assessment and Plan: History of DM 2, GERD, HTN and microcytic anemia.  Patient presented to to [facility]/2022 as a transfer from [facility] due to not having GI capability. Patient presented to [facility] 2 days prior complaining of abdominal pain status post cholecystectomy on 02/19/2022.  Patient had imaging at outside facility that noted questionable abscess in gallbladder fossa.  Ava Swayze, DO, has referred patient to IR for aspiration of intra-abdominal abscess with possible drain placement.  Procedure was approved by Ruel Favors, MD.  Procedure is tentatively scheduled for 03/11/2022.  Patient sitting upright in recliner. She is alert and oriented, calm and pleasant. She is in no distress. Pt states she is ready to go home.  Patient is aware she will be n.p.o. at midnight tonight for procedure tomorrow. Lovenox held.  Risks and benefits of aspiration of intra-abdominal abscess with possible drain placement under moderate sedation discussed with the patient including bleeding, infection, damage to adjacent structures, bowel perforation/fistula connection, and sepsis.  All of the patient's questions were answered, patient is agreeable to proceed. Consent signed and in chart.   Thank you for this interesting consult.  I greatly enjoyed meeting Tricia Davona Kinoshita and look forward to participating in their care.  A copy of this report was sent to the requesting provider on this date.  Electronically Signed: Shon Hough, NP 03/10/2022, 2:02 PM   I spent a total of 20 minutes in face to face in clinical consultation, greater than 50% of which was counseling/coordinating care for aspiration of intra-abdominal abscess with possible drain placement.

## 2022-03-10 NOTE — Progress Notes (Addendum)
?PROGRESS NOTE ? ?Tricia Butler HCW:[id number] DOB: 1946-03-25 DOA: 03/08/2022 ?PCP: System, Provider Not In ? ?Brief History   ?Tricia Butler is a 76 y.o. female with medical history significant of DM2, GERD, HTN. Presenting with abdominal pain. She had a recent cholecystectomy (02/19/22). Apparently a cholangiogram was performed and there were retained stones identified. The patient presented to [facility] 2 days ago with abdominal pain. She reports it was sudden onset. It was located in the RUQ. It was sharp. She didn't have any fever, nausea, or vomiting. Work up, including an MRCP, revealed CBD dilation w/ concern for an obstructing stone. [facility] hospital requested transfer to [facility] d/t not having GI services available. ? ?The patient was seen prior to her ERCP on 03/09/2022. The patient tolerated the procedure well. The patient underwent sphincterotomy with removal of stones and observation of significant flow.  ? ?Dr. Watt Climes has reviewed films from outside facility and notes that there is a question of abscess in the gallbladder fossa. This will be discussed with IR to see if it is appropriate for drainage and perhaps leaving a drain in place. Fluid would be cultured and determination of whether or not the patient would require IV antibiotics and perhaps general surgery consult. ? ?Consultants  ?Gastroenterology ? ?Procedures  ?ERCP ? ?Antibiotics  ? ?Anti-infectives (From admission, onward)  ? ? Start     Dose/Rate Route Frequency Ordered Stop  ? 03/09/22 0600  piperacillin-tazobactam (ZOSYN) IVPB 3.375 g       ? 3.375 g ?12.5 mL/hr over 240 Minutes Intravenous Every 8 hours 03/08/22 1901    ? ?  ? ?Subjective  ?The patient is sitting up at bedside. No new complaints. Daughter is at bedside ? ?Objective  ? ?Vitals:  ?Vitals:  ? 03/09/22 2132 03/10/22 1761  ?BP: (!) 165/71 (!) 158/70  ?Pulse: 78 73  ?Resp: 18 18  ?Temp: 98.5 ?F (36.9 ?C) (!) 97.3 ?F (36.3 ?C)  ?SpO2: 90% 92%   ? ? ?Exam: ? ?Constitutional:  ?The patient is awake, alert, and oriented x 3. No acute distress. ?Respiratory:  ?No increased work of breathing. ?No wheezes, rales, or rhonchi ?No tactile fremitus ?Cardiovascular:  ?Regular rate and rhythm ?No murmurs, ectopy, or gallups. ?No lateral PMI. No thrills. ?Abdomen:  ?Abdomen is soft, non-tender, non-distended ?No hernias, masses, or organomegaly ?Normoactive bowel sounds.  ?Musculoskeletal:  ?No cyanosis, clubbing, or edema ?Skin:  ?No rashes, lesions, ulcers ?palpation of skin: no induration or nodules ?Neurologic:  ?CN 2-12 intact ?Sensation all 4 extremities intact ?Psychiatric:  ?Mental status ?Mood, affect appropriate ?Orientation to person, place, time  ?judgment and insight appear intact ? ?I have personally reviewed the following:  ? ?Today's Data  ?Vitals ? ?Lab Data  ?CMP ?CBC ? ?Micro Data  ?Blood cultures x 2 have had no growth. ? ?Imaging  ? ? ?Cardiology Data  ? ? ?Other Data  ? ? ?Scheduled Meds: ? enoxaparin (LOVENOX) injection  40 mg Subcutaneous Q24H  ? insulin aspart  0-9 Units Subcutaneous TID WC  ? pantoprazole  40 mg Oral Daily  ? ?Continuous Infusions: ? piperacillin-tazobactam (ZOSYN)  IV 3.375 g (03/10/22 0447)  ? ? ?Principal Problem: ?  Choledocholithiasis ?Active Problems: ?  HTN (hypertension) ?  Elevated LFTs ?  DM2 (diabetes mellitus, type 2) ([place]) ?  GERD (gastroesophageal reflux disease) ?  Macrocytic anemia ? ? LOS: 2 days  ? ?A & P  ?Choledocholithiasis/Elevated LFT's: Pt underwent ERCP with Dr. Watt Climes on 03/09/2022. There was  sphincterotomy with removal of a stone, sweep of debris, and return of patency of duct and sphincter. There remains a question of possible abscess in the gallbladder fossa. Interventional radiology will be consulted to determine if there is a fluid collection that is amenable to drain placement. General surgery may also be required depending upon determination. Blood culture x 2 has had no growth. Patient is  receiving IV Zosyn. Monitor LFT's. ? ?Macrocytic anemia: Likely due to chronic inflammation. ? ?DM II: Glucoses are controlled with Actos and Semaglutide as outpatient. As inpatient the patient's glucoses are being managed with FSBS and SSI. ? ?Hypertension: Blood pressures have been running high, although the patient has not received her usual home meds. These will be restarted following her procedure. ? ?GERD: Continue PPI as at home. ? ?I have seen and examined this patient myself. I have spent 34 minutes in her evaluation and care. ? ?DVT prophylaxis: Lovenox ?Code Status: Full Code ?Family Communication: None available ?Disposition Plan: Home  ? ? ?Mylie Mccurley, DO ?[facility] ?Direct contact: see [email or web address]  ?7PM-7AM contact night coverage as above ?03/10/2022, 1:17 PM  LOS: 1 day  ? ?ADDENDUM: ?This patient was discussed with Dr. Reesa Chew. Percutaneous drainage of the gallbladder fossa will be done tomorrow. ? ? ? ?  ?

## 2022-03-10 NOTE — Progress Notes (Signed)
Tricia Butler ?9:58 AM ? ?Subjective: ?Patient doing well from her ERCP and no other complaints and she is about to have her diet advanced and her case discussed with her daughter ? ?Objective: ?Vital signs stable afebrile no acute distress abdomen is soft nontender LFTs decreased CBC okay ? ?Assessment: ?Status post ERCP and patient who may have a small abscess in her gallbladder fossa ? ?Plan: ?See yesterday note about IR consult to determine whether she needs the gallbladder fossa aspirated or a drain placed or a trial of antibiotics and repeat imaging and please call us if we can be of any further assistance with this hospital stay and have discussed with the patient and her daughter about repeat liver tests as an outpatient in a week or 2 to confirm back to normal ? ?[facility] ? ?office [phone] ?After 5PM or if no answer call [phone]  ?

## 2022-03-11 ENCOUNTER — Inpatient Hospital Stay (HOSPITAL_COMMUNITY): Payer: Medicare HMO

## 2022-03-11 ENCOUNTER — Encounter (HOSPITAL_COMMUNITY): Payer: Self-pay | Admitting: Gastroenterology

## 2022-03-11 LAB — CBC WITH DIFFERENTIAL/PLATELET
Abs Immature Granulocytes: 0.06 10*3/uL (ref 0.00–0.07)
Basophils Absolute: 0.1 10*3/uL (ref 0.0–0.1)
Basophils Relative: 1 %
Eosinophils Absolute: 0.2 10*3/uL (ref 0.0–0.5)
Eosinophils Relative: 3 %
HCT: 30.3 % — ABNORMAL LOW (ref 36.0–46.0)
Hemoglobin: 9.1 g/dL — ABNORMAL LOW (ref 12.0–15.0)
Immature Granulocytes: 1 %
Lymphocytes Relative: 30 %
Lymphs Abs: 1.7 10*3/uL (ref 0.7–4.0)
MCH: 30.5 pg (ref 26.0–34.0)
MCHC: 30 g/dL (ref 30.0–36.0)
MCV: 101.7 fL — ABNORMAL HIGH (ref 80.0–100.0)
Monocytes Absolute: 0.6 10*3/uL (ref 0.1–1.0)
Monocytes Relative: 10 %
Neutro Abs: 3.3 10*3/uL (ref 1.7–7.7)
Neutrophils Relative %: 55 %
Platelets: 238 10*3/uL (ref 150–400)
RBC: 2.98 MIL/uL — ABNORMAL LOW (ref 3.87–5.11)
RDW: 13.9 % (ref 11.5–15.5)
WBC: 5.9 10*3/uL (ref 4.0–10.5)
nRBC: 0 % (ref 0.0–0.2)

## 2022-03-11 LAB — COMPREHENSIVE METABOLIC PANEL
ALT: 42 U/L (ref 0–44)
AST: 53 U/L — ABNORMAL HIGH (ref 15–41)
Albumin: 2.4 g/dL — ABNORMAL LOW (ref 3.5–5.0)
Alkaline Phosphatase: 152 U/L — ABNORMAL HIGH (ref 38–126)
Anion gap: 9 (ref 5–15)
BUN: 13 mg/dL (ref 8–23)
CO2: 31 mmol/L (ref 22–32)
Calcium: 7.9 mg/dL — ABNORMAL LOW (ref 8.9–10.3)
Chloride: 102 mmol/L (ref 98–111)
Creatinine, Ser: 1.25 mg/dL — ABNORMAL HIGH (ref 0.44–1.00)
GFR, Estimated: 44 mL/min — ABNORMAL LOW (ref >60)
Glucose, Bld: 131 mg/dL — ABNORMAL HIGH (ref 70–99)
Potassium: 3.5 mmol/L (ref 3.5–5.1)
Sodium: 142 mmol/L (ref 135–145)
Total Bilirubin: 0.6 mg/dL (ref 0.3–1.2)
Total Protein: 7.5 g/dL (ref 6.5–8.1)

## 2022-03-11 LAB — GLUCOSE, CAPILLARY
Glucose-Capillary: 100 mg/dL — ABNORMAL HIGH (ref 70–99)
Glucose-Capillary: 118 mg/dL — ABNORMAL HIGH (ref 70–99)

## 2022-03-11 MED ORDER — NALOXONE HCL 0.4 MG/ML IJ SOLN
INTRAMUSCULAR | Status: AC
  Filled 2022-03-11: qty 1

## 2022-03-11 MED ORDER — MIDAZOLAM HCL 2 MG/2ML IJ SOLN
INTRAMUSCULAR | Status: AC | PRN
  Administered 2022-03-11 (×2): 1 mg via INTRAVENOUS

## 2022-03-11 MED ORDER — ENOXAPARIN SODIUM 40 MG/0.4ML IJ SOSY
40.0000 mg | PREFILLED_SYRINGE | INTRAMUSCULAR | Status: DC
Start: 2022-03-12 — End: 2022-03-13
  Administered 2022-03-12 – 2022-03-13 (×2): 40 mg via SUBCUTANEOUS
  Filled 2022-03-11 (×2): qty 0.4

## 2022-03-11 MED ORDER — FLUMAZENIL 0.5 MG/5ML IV SOLN
INTRAVENOUS | Status: AC
  Filled 2022-03-11: qty 5

## 2022-03-11 MED ORDER — FENTANYL CITRATE (PF) 100 MCG/2ML IJ SOLN
INTRAMUSCULAR | Status: AC | PRN
  Administered 2022-03-11 (×2): 50 ug via INTRAVENOUS

## 2022-03-11 MED ORDER — MIDAZOLAM HCL 2 MG/2ML IJ SOLN
INTRAMUSCULAR | Status: AC
  Filled 2022-03-11: qty 6

## 2022-03-11 MED ORDER — SODIUM CHLORIDE 0.9 % IV SOLN
INTRAVENOUS | Status: AC
  Filled 2022-03-11: qty 250

## 2022-03-11 MED ORDER — FENTANYL CITRATE (PF) 100 MCG/2ML IJ SOLN
INTRAMUSCULAR | Status: AC
  Filled 2022-03-11: qty 4

## 2022-03-11 MED ORDER — SODIUM CHLORIDE 0.9% FLUSH
5.0000 mL | Freq: Three times a day (TID) | INTRAVENOUS | Status: DC
  Administered 2022-03-11 – 2022-03-13 (×4): 5 mL

## 2022-03-11 MED ORDER — HYDROCODONE-ACETAMINOPHEN 5-325 MG PO TABS
1.0000 | ORAL_TABLET | ORAL | Status: DC | PRN
  Administered 2022-03-12: 2 via ORAL
  Filled 2022-03-11 (×3): qty 2

## 2022-03-11 NOTE — Progress Notes (Signed)
Transition of Care (TOC) Screening Note ? ?Patient Details  ?Name: Tricia Butler ?Date of Birth: Feb 01, 1946 ? ?Transition of Care (TOC) CM/SW Contact:    ?Sherie Don, LCSW ?Phone Number: ?03/11/2022, 10:44 AM ? ?Transition of Care Department [facility]) has reviewed patient and no TOC needs have been identified at this time. We will continue to monitor patient advancement through interdisciplinary progression rounds. If new patient transition needs arise, please place a TOC consult. ?

## 2022-03-11 NOTE — Progress Notes (Signed)
MEDICATION-RELATED CONSULT NOTE  ? ?IR Procedure Consult - Anticoagulant/Antiplatelet PTA/Inpatient Med List Review by Pharmacist  ? ? ?Procedure: CT guided GB fossa drain placement  ?   ?Completed: 03/11/2022 1300 ? ?Post-Procedural bleeding risk per IR MD assessment:  High ? ?Antithrombotic medications on inpatient or PTA profile prior to procedure:    ?Enoxaparin 40 mg subq every 24 hours  ? ?Recommended restart time per IR Post-Procedure Guidelines:  Day + 1 (Next AM) ? ? ?Other considerations:    ? ? ?Plan:     ?Restart enoxaparin 40 mg subq every 24 hours starting tomorrow morning. ?Monitor CBC, signs/symptoms of bleeding ? ? ? ?Royetta Asal, PharmD, BCPS ?Clinical Pharmacist ?[place] ?Please utilize Amion for appropriate phone number to reach the unit pharmacist ([place]) ?03/11/2022 1:42 PM ? ? ? ? ?

## 2022-03-11 NOTE — Progress Notes (Signed)
?PROGRESS NOTE ? ?Tricia Butler OBS:[id number] DOB: 1946/04/16 DOA: 03/08/2022 ?PCP: System, Provider Not In ? ?Brief History   ?Tricia Butler is a 76 y.o. female with medical history significant of DM2, GERD, HTN. Presenting with abdominal pain. She had a recent cholecystectomy (02/19/22). Apparently a cholangiogram was performed and there were retained stones identified. The patient presented to [facility] 2 days ago with abdominal pain. She reports it was sudden onset. It was located in the RUQ. It was sharp. She didn't have any fever, nausea, or vomiting. Work up, including an MRCP, revealed CBD dilation w/ concern for an obstructing stone. [facility] hospital requested transfer to [facility] d/t not having GI services available. ? ?The patient was seen prior to her ERCP on 03/09/2022. The patient tolerated the procedure well. The patient underwent sphincterotomy with removal of stones and observation of significant flow.  ? ?Dr. Watt Climes has reviewed films from outside facility and notes that there is a question of abscess in the gallbladder fossa. This was discussed with IR. She will go for percutaneous drainage later today. Fluid will be cultured . She will be continued on IV antibiotics. ? ?Consultants  ?Gastroenterology ?Interventional radiology ? ?Procedures  ?ERCP ? ?Antibiotics  ? ?Anti-infectives (From admission, onward)  ? ? Start     Dose/Rate Route Frequency Ordered Stop  ? 03/09/22 0600  piperacillin-tazobactam (ZOSYN) IVPB 3.375 g       ? 3.375 g ?12.5 mL/hr over 240 Minutes Intravenous Every 8 hours 03/08/22 1901    ? ?  ? ?Subjective  ?The patient is sitting up at bedside. No new complaints. Daughter is at bedside. ? ?Objective  ? ?Vitals:  ?Vitals:  ? 03/11/22 1245 03/11/22 1306  ?BP: 133/76 139/74  ?Pulse: 65 75  ?Resp: 15 15  ?Temp:  98.6 ?F (37 ?C)  ?SpO2: 100% 94%  ? ? ?Exam: ? ?Constitutional:  ?The patient is awake, alert, and oriented x 3. No acute distress. ?Respiratory:  ?No increased  work of breathing. ?No wheezes, rales, or rhonchi ?No tactile fremitus ?Cardiovascular:  ?Regular rate and rhythm ?No murmurs, ectopy, or gallups. ?No lateral PMI. No thrills. ?Abdomen:  ?Abdomen is soft. There is mild tenderness in the epigastrum. ?No hernias, masses, or organomegaly ?Normoactive bowel sounds.  ?Musculoskeletal:  ?No cyanosis, clubbing, or edema ?Skin:  ?No rashes, lesions, ulcers ?palpation of skin: no induration or nodules ?Neurologic:  ?CN 2-12 intact ?Sensation all 4 extremities intact ?Psychiatric:  ?Mental status ?Mood, affect appropriate ?Orientation to person, place, time  ?judgment and insight appear intact ? ?I have personally reviewed the following:  ? ?Today's Data  ?Vitals ? ?Lab Data  ?CMP ?CBC ? ?Micro Data  ?Blood cultures x 2 have had no growth. ? ?Imaging  ? ? ?Cardiology Data  ? ? ?Other Data  ? ? ?Scheduled Meds: ? [START ON 03/12/2022] enoxaparin (LOVENOX) injection  40 mg Subcutaneous Q24H  ? insulin aspart  0-9 Units Subcutaneous TID WC  ? pantoprazole  40 mg Oral Daily  ? ?Continuous Infusions: ? piperacillin-tazobactam (ZOSYN)  IV 3.375 g (03/11/22 1312)  ? ? ?Principal Problem: ?  Choledocholithiasis ?Active Problems: ?  HTN (hypertension) ?  Elevated LFTs ?  DM2 (diabetes mellitus, type 2) ([place]) ?  GERD (gastroesophageal reflux disease) ?  Macrocytic anemia ? ? LOS: 3 days  ? ?A & P  ?Choledocholithiasis/Elevated LFT's: Pt underwent ERCP with Dr. Watt Climes on 03/09/2022. There was sphincterotomy with removal of a stone, sweep of debris, and return of patency  of duct and sphincter. There remains a question of possible abscess in the gallbladder fossa. The patient was discussed with Dr. Reesa Chew. The patient will undergo percutaneous drainage later today. Fluid will be cultured. Blood culture x 2 has had no growth. Patient is receiving IV Zosyn. Monitor LFT's. ? ?Macrocytic anemia: Likely due to chronic inflammation. ? ?DM II: Glucoses are controlled with Actos and Semaglutide as  outpatient. As inpatient the patient's glucoses are being managed with FSBS and SSI. ? ?Hypertension: Blood pressures have been running high, although the patient has not received her usual home meds. These will be restarted following her procedure. ? ?GERD: Continue PPI as at home. ? ?I have seen and examined this patient myself. I have spent 32 minutes in her evaluation and care. ? ?DVT prophylaxis: Lovenox ?Code Status: Full Code ?Family Communication: None available ?Disposition Plan: Home  ? ? ?Jamilya Sarrazin, DO ?[facility] ?Direct contact: see [email or web address]  ?7PM-7AM contact night coverage as above ?03/11/2022, 4:47 PM  LOS: 1 day  ? ? ? ? ? ?  ?

## 2022-03-11 NOTE — Procedures (Signed)
?  Procedure:  CT guided GB fossa drain placement 64f?Preprocedure diagnosis: The encounter diagnosis was Choledocholithiasis. ? ?Postprocedure diagnosis: same ?EBL:    minimal ?Complications:   none immediate ? ?[facility] ? ?D. DArne ClevelandMD ?Main # [phone]?Pager  [phone] ?Mobile [phone] ?  ? ?

## 2022-03-12 DIAGNOSIS — E66812 Obesity, class 2: Secondary | ICD-10-CM

## 2022-03-12 DIAGNOSIS — E669 Obesity, unspecified: Secondary | ICD-10-CM

## 2022-03-12 DIAGNOSIS — K651 Peritoneal abscess: Secondary | ICD-10-CM

## 2022-03-12 LAB — COMPREHENSIVE METABOLIC PANEL
ALT: 35 U/L (ref 0–44)
AST: 46 U/L — ABNORMAL HIGH (ref 15–41)
Albumin: 2.5 g/dL — ABNORMAL LOW (ref 3.5–5.0)
Alkaline Phosphatase: 141 U/L — ABNORMAL HIGH (ref 38–126)
Anion gap: 8 (ref 5–15)
BUN: 9 mg/dL (ref 8–23)
CO2: 31 mmol/L (ref 22–32)
Calcium: 8 mg/dL — ABNORMAL LOW (ref 8.9–10.3)
Chloride: 101 mmol/L (ref 98–111)
Creatinine, Ser: 0.99 mg/dL (ref 0.44–1.00)
GFR, Estimated: 59 mL/min — ABNORMAL LOW (ref >60)
Glucose, Bld: 107 mg/dL — ABNORMAL HIGH (ref 70–99)
Potassium: 3.9 mmol/L (ref 3.5–5.1)
Sodium: 140 mmol/L (ref 135–145)
Total Bilirubin: 0.7 mg/dL (ref 0.3–1.2)
Total Protein: 8.1 g/dL (ref 6.5–8.1)

## 2022-03-12 LAB — CBC WITH DIFFERENTIAL/PLATELET
Abs Immature Granulocytes: 0.08 10*3/uL — ABNORMAL HIGH (ref 0.00–0.07)
Basophils Absolute: 0.1 10*3/uL (ref 0.0–0.1)
Basophils Relative: 1 %
Eosinophils Absolute: 0.3 10*3/uL (ref 0.0–0.5)
Eosinophils Relative: 4 %
HCT: 32 % — ABNORMAL LOW (ref 36.0–46.0)
Hemoglobin: 9.8 g/dL — ABNORMAL LOW (ref 12.0–15.0)
Immature Granulocytes: 1 %
Lymphocytes Relative: 44 %
Lymphs Abs: 3 10*3/uL (ref 0.7–4.0)
MCH: 30.8 pg (ref 26.0–34.0)
MCHC: 30.6 g/dL (ref 30.0–36.0)
MCV: 100.6 fL — ABNORMAL HIGH (ref 80.0–100.0)
Monocytes Absolute: 0.7 10*3/uL (ref 0.1–1.0)
Monocytes Relative: 10 %
Neutro Abs: 2.8 10*3/uL (ref 1.7–7.7)
Neutrophils Relative %: 40 %
Platelets: 241 10*3/uL (ref 150–400)
RBC: 3.18 MIL/uL — ABNORMAL LOW (ref 3.87–5.11)
RDW: 14.2 % (ref 11.5–15.5)
WBC: 7 10*3/uL (ref 4.0–10.5)
nRBC: 0 % (ref 0.0–0.2)

## 2022-03-12 LAB — GLUCOSE, CAPILLARY
Glucose-Capillary: 100 mg/dL — ABNORMAL HIGH (ref 70–99)
Glucose-Capillary: 100 mg/dL — ABNORMAL HIGH (ref 70–99)
Glucose-Capillary: 132 mg/dL — ABNORMAL HIGH (ref 70–99)
Glucose-Capillary: 155 mg/dL — ABNORMAL HIGH (ref 70–99)
Glucose-Capillary: 166 mg/dL — ABNORMAL HIGH (ref 70–99)

## 2022-03-12 MED ORDER — ASPIRIN EC 81 MG PO TBEC
81.0000 mg | DELAYED_RELEASE_TABLET | Freq: Every day | ORAL | Status: DC
Start: 2022-03-13 — End: 2022-03-13
  Administered 2022-03-13: 81 mg via ORAL
  Filled 2022-03-12: qty 1

## 2022-03-12 MED ORDER — CITALOPRAM HYDROBROMIDE 20 MG PO TABS
10.0000 mg | ORAL_TABLET | Freq: Every day | ORAL | Status: DC
  Administered 2022-03-12: 10 mg via ORAL
  Filled 2022-03-12: qty 1

## 2022-03-12 MED ORDER — VITAMIN D 25 MCG (1000 UNIT) PO TABS
1000.0000 [IU] | ORAL_TABLET | Freq: Every day | ORAL | Status: DC
  Administered 2022-03-12 – 2022-03-13 (×2): 1000 [IU] via ORAL
  Filled 2022-03-12 (×2): qty 1

## 2022-03-12 MED ORDER — ATORVASTATIN CALCIUM 40 MG PO TABS: 40.0000 mg | ORAL_TABLET | Freq: Every day | ORAL | Status: DC

## 2022-03-12 MED ORDER — ADULT MULTIVITAMIN W/MINERALS CH
1.0000 | ORAL_TABLET | Freq: Every day | ORAL | Status: DC
  Administered 2022-03-12 – 2022-03-13 (×2): 1 via ORAL
  Filled 2022-03-12 (×2): qty 1

## 2022-03-12 MED ORDER — ALBUTEROL SULFATE (2.5 MG/3ML) 0.083% IN NEBU: 2.5000 mg | INHALATION_SOLUTION | Freq: Four times a day (QID) | RESPIRATORY_TRACT | Status: DC | PRN

## 2022-03-12 MED ORDER — METOPROLOL TARTRATE 50 MG PO TABS
50.0000 mg | ORAL_TABLET | Freq: Two times a day (BID) | ORAL | Status: DC
  Administered 2022-03-12 – 2022-03-13 (×3): 50 mg via ORAL
  Filled 2022-03-12 (×3): qty 1

## 2022-03-12 NOTE — Progress Notes (Addendum)
? ? ?Referring Physician(s): Swayze, Ava ? ?Supervising Physician: Michaelle Birks ? ?Patient Status:  [facility] - In-pt ? ?Chief Complaint: ? ?Fluid collection development  in the GB fossa after recent cholecystectomy on 02/19/22 ?S/p aspiration and drain placement by Dr. Vernard Gambles on 03/11/22 ? ?Subjective: ? ?Patient laying in ned,NAD. Daughter at bedside.  ?Reports soreness on RUQ this morning, the soreness is much better than yesterday.  ?Denies N/V.  ?Patient states that the drain was flushed twice by RN overnight.  ? ?Allergies: ?Patient has no known allergies. ? ?Medications: ?Prior to Admission medications   ?Medication Sig Start Date End Date Taking? Authorizing Provider  ?albuterol (VENTOLIN HFA) 108 (90 Base) MCG/ACT inhaler Inhale 2 puffs into the lungs every 6 (six) hours as needed for wheezing or shortness of breath.   Yes [provider]  ?aspirin EC 81 MG tablet Take 81 mg by mouth in the morning. Swallow whole.   Yes [provider]  ?atorvastatin (LIPITOR) 40 MG tablet Take 40 mg by mouth at bedtime.   Yes [provider]  ?Cholecalciferol (VITAMIN D-3) 25 MCG (1000 UT) CAPS Take 1,000 Units by mouth in the morning.   Yes [provider]  ?citalopram (CELEXA) 10 MG tablet Take 10 mg by mouth at bedtime.   Yes [provider]  ?diazepam (VALIUM) 5 MG tablet Take 5 mg by mouth daily as needed for anxiety.   Yes [provider]  ?Garlic 7494 MG CAPS Take 1,000 mg by mouth in the morning.   Yes [provider]  ?hydrochlorothiazide (MICROZIDE) 12.5 MG capsule Take 12.5 mg by mouth in the morning.   Yes [provider]  ?lisinopril (ZESTRIL) 5 MG tablet Take 5 mg by mouth in the morning.   Yes [provider]  ?metoprolol tartrate (LOPRESSOR) 100 MG tablet Take 50 mg by mouth in the morning and at bedtime.   Yes [provider]  ?Multiple Vitamins-Minerals (ONE-A-DAY WOMENS 50+) TABS Take 1 tablet by mouth daily with  breakfast.   Yes [provider]  ?niacin 500 MG tablet Take 500 mg by mouth in the morning.   Yes [provider]  ?Omega-3 Fatty Acids (FISH OIL) 1200 MG CAPS Take 1,200 mg by mouth in the morning and at bedtime.   Yes [provider]  ?omeprazole (PRILOSEC) 10 MG capsule Take 10 mg by mouth daily before breakfast.   Yes [provider]  ?pioglitazone (ACTOS) 30 MG tablet Take 30 mg by mouth in the morning.   Yes [provider]  ?Semaglutide,0.25 or 0.'5MG'$ /DOS, (OZEMPIC, 0.25 OR 0.5 MG/DOSE,) 2 MG/1.5ML SOPN Inject 0.25 mg into the skin every Saturday. ?Patient not taking: Reported on 03/08/2022    [provider]  ? ? ? ?Vital Signs: ?BP (!) 131/55 (BP Location: Right Arm)   Pulse 71   Temp 98.1 ?F (36.7 ?C) (Oral)   Resp 18   Ht '5\' 3"'$  (1.6 m)   Wt 222 lb 14.2 oz (101.1 kg)   SpO2 93%   BMI 39.48 kg/m?  ? ?Physical Exam ?Vitals reviewed.  ?Constitutional:   ?   General: She is not in acute distress. ?   Appearance: Normal appearance. She is not ill-appearing.  ?HENT:  ?   Head: Normocephalic and atraumatic.  ?Pulmonary:  ?   Effort: Pulmonary effort is normal.  ?Abdominal:  ?   General: Abdomen is flat.  ?   Palpations: Abdomen is soft.  ?Skin: ?   General: Skin is  warm and dry.  ?   Coloration: Skin is not jaundiced or pale.  ?   Comments: Positive RUQ drain to a gravity bag. Site is unremarkable with no erythema, edema, tenderness, bleeding or drainage. Suture and stat lock in place. Dressing is clean, dry, and intact. Trace ml of  light brown colored fluid noted in the bag. Drain flushes well, does not aspirate. Tubing flushed with NS, no clogging noted.   ?Neurological:  ?   Mental Status: She is alert and oriented to person, place, and time.  ?Psychiatric:     ?   Mood and Affect: Mood normal.     ?   Behavior: Behavior normal.  ? ? ?Imaging: ?DG ERCP ? ?Result Date: 03/09/2022 ?CLINICAL DATA:  ERCP EXAM: ERCP TECHNIQUE: Multiple spot images obtained  with the fluoroscopic device and submitted for interpretation post-procedure. FLUOROSCOPY: Refer to report COMPARISON:  None. FINDINGS: A total of 2 fluoroscopic spot images obtained during ERCP are submitted for review. Images demonstrate a scope overlying the upper abdomen. There is wire catheterization and contrast injection of the common bile duct. Images are not calibrated for measurements. The extrahepatic biliary tree appears normal in contour. The intrahepatic biliary tree is not well visualized. Final images demonstrate a balloon inflated in the distal common bile duct. IMPRESSION: ERCP as described.  Refer to procedure report for full details. These images were submitted for radiologic interpretation only. Please see the procedural report for the amount of contrast and the fluoroscopy time utilized. Electronically Signed   By: Albin Felling M.D.   On: 03/09/2022 12:53  ? ?CT IMAGE GUIDED DRAINAGE BY PERCUTANEOUS CATHETER ? ?Result Date: 03/11/2022 ?CLINICAL DATA:  Abscess in gallbladder fossa EXAM: CT GUIDED DRAINAGE OF GALLBLADDER FOSSA ABSCESS ANESTHESIA/SEDATION: Intravenous Fentanyl 127mg and Versed '2mg'$  were administered as conscious sedation during continuous monitoring of the patient's level of consciousness and physiological / cardiorespiratory status by the radiology RN, with a total moderate sedation time of 19 minutes. PROCEDURE: The procedure, risks, benefits, and alternatives were explained to the patient. Questions regarding the procedure were encouraged and answered. The patient understands and consents to the procedure. Select axial scans through the upper abdomen were obtained. An appropriate skin entry site was determined and marked. The operative field was prepped with chlorhexidinein a sterile fashion, and a sterile drape was applied covering the operative field. A sterile gown and sterile gloves were used for the procedure. Local anesthesia was provided with 1% Lidocaine. 135gauge  trocar needle was advanced into the subhepatic collection using trans pedicle approach taking care to avoid the colon. An Amplatz guidewire formed within the collection. Tract dilated to facilitate placement of a 10 French pigtail drain catheter, formed centrally within the collection. Thick opaque clot-like material could be aspirated, sample sent for Gram stain and culture. Catheter secured with 0 Prolene suture and StatLock and placed to gravity drain bag. The patient tolerated the procedure well. COMPLICATIONS: None immediate FINDINGS: Loculated gas and fluid collection in the gallbladder fossa was localized. 10 French pigtail drain catheter placed as above. 5 mL of thick aspirate sent for Gram stain and culture. IMPRESSION: 1. Technically successful CT-guided abscess drain catheter placement, gallbladder fossa. RADIATION DOSE REDUCTION: This exam was performed according to the departmental dose-optimization program which includes automated exposure control, adjustment of the mA and/or kV according to patient size and/or use of iterative reconstruction technique. Electronically Signed   By: DLucrezia EuropeM.D.   On: 03/11/2022 14:45   ? ?Labs: ? ?  CBC: ?Recent Labs  ?  03/09/22 ?0110 03/10/22 ?0430 03/11/22 ?0423 03/12/22 ?8309  ?WBC 7.3 5.1 5.9 7.0  ?HGB 9.5* 8.8* 9.1* 9.8*  ?HCT 29.8* 28.7* 30.3* 32.0*  ?PLT 228 223 238 241  ? ? ?COAGS: ?No results for input(s): INR, APTT in the last 8760 hours. ? ?BMP: ?Recent Labs  ?  03/09/22 ?0110 03/10/22 ?0430 03/11/22 ?0423 03/12/22 ?4076  ?NA 138 138 142 140  ?K 3.6 4.3 3.5 3.9  ?CL 100 99 102 101  ?CO2 32 '30 31 31  '$ ?GLUCOSE 99 130* 131* 107*  ?BUN '11 13 13 9  '$ ?CALCIUM 8.5* 8.0* 7.9* 8.0*  ?CREATININE 0.81 1.01* 1.25* 0.99  ?GFRNONAA >60 57* 44* 59*  ? ? ?LIVER FUNCTION TESTS: ?Recent Labs  ?  03/09/22 ?0110 03/10/22 ?0430 03/11/22 ?0423 03/12/22 ?8088  ?BILITOT 1.7* 1.0 0.6 0.7  ?AST 123* 76* 53* 46*  ?ALT 70* 51* 42 35  ?ALKPHOS 214* 191* 152* 141*  ?PROT 8.0 7.7 7.5 8.1   ?ALBUMIN 2.5* 2.4* 2.4* 2.5*  ? ? ?Assessment and Plan: ? ?76 y.o. female s/p cholecystectomy on 12/04/29, recovery complicated by persistent RUQ pain, found to have fluid collection in GB fossa, s/p aspiration and dra

## 2022-03-12 NOTE — Progress Notes (Signed)
?PROGRESS NOTE ?Tricia Butler  KGU:[id number] DOB: Mar 01, 1946 DOA: 03/08/2022 ?PCP: System, Provider Not In  ? ?Brief Narrative/Hospital Course: ?76 y.o. female with T2DM, GERD, HTN, recent cholecystectomy 3/28, apparently cholangiogram showed retained stones, presented with abdominal pain to [facility] 2 days PTA with abdominal pain. Work up, including an MRCP, revealed CBD dilation w/ concern for an obstructing stone. [facility] hospital requested transfer to [facility] d/t not having GI services available. ?She is s/p ERCP on 03/09/2022. The patient tolerated the procedure well. The patient underwent sphincterotomy with removal of stones and observation of significant flow.  ?Dr. Watt Climes has reviewed films from outside facility and notes that there is a question of abscess in the gallbladder fossa. This was discussed with IR and s/p percutaneous drainage CT guided drain 4/17 and being managed with IV antibiotics.   ?  ?Subjective: ?Examined this morning.  No nausea vomiting, abdominal pain is much better today. ? ?  ?Assessment and Plan: ?Principal Problem: ?  Choledocholithiasis ?Active Problems: ?  HTN (hypertension) ?  Elevated LFTs ?  DM2 (diabetes mellitus, type 2) ([place]) ?  GERD (gastroesophageal reflux disease) ?  Macrocytic anemia ?  Abscess of abdominal cavity ([place]) ?  Obesity, Class II, BMI 35-39.9 ? ?Choledocholithiasis ?Gall bladder fossa abscess/abdominal abscess: ?Recent cholecystectomy 3/28.  Presenting with abdominal pain,MRCP w/ CBD dilatation-s/p ERCP on 03/09/2022. S.p CT-guided drainage 4/17, follow-up drain culture Gram stain showed GPC-Enterococcus faecium.  Being managed with IV Zosyn.  Adjust antibiotic based on culture sensitivity.  IR following for drain care ? ?Drain Care: ?Continue TID flushes with 5 cc NS. ?Record output Q shift. ?Dressing changes QD or PRN if soiled.  ?Call IR APP or on call IR MD if difficulty flushing or sudden change in drain output.  ?Repeat imaging/possible drain  injection once output < 10 mL/QD (excluding flush material.) ?  ?Discharge planning: ?Typically patient will follow up with IR clinic 10-14 days post d/c for repeat imaging/possible drain injection. IR scheduler will contact patient with date/time of appointment. Patient will need to flush drain QD with 5 cc NS, record output QD, dressing changes every 2-3 days or earlier if soiled.  ? ?HTN : BP is controlled.  Holding home lisinopril HCTZ, resume low-dose metoprolol ( at home on 50 bid) ?HLD: Resume statin and aspirin in a.m. ? ?Elevated LFTs-in the setting of #1, trend.  Improving. ? ?DM2: Blood sugar controlled, holding home with safety.  Actos.  Continue sliding scale ?Recent Labs  ?Lab 03/08/22 ?1706 03/09/22 ?0735 03/10/22 ?2145 03/11/22 ?6283 03/11/22 ?1652 03/12/22 ?0006 03/12/22 ?1517  ?GLUCAP  --    < > 129* 100* 118* 100* 100*  ?HGBA1C 6.2*  --   --   --   --   --   --   ? < > = values in this interval not displayed.  ?  ?GERD: Continue PPI ? ?Macrocytic anemia: Hemoglobin holding 9 g range.  Monitor ?Recent Labs  ?Lab 03/08/22 ?1706 03/09/22 ?0110 03/10/22 ?0430 03/11/22 ?0423 03/12/22 ?6160  ?HGB 9.4* 9.5* 8.8* 9.1* 9.8*  ?HCT 30.0* 29.8* 28.7* 30.3* 32.0*  ?  ?Class II Obesity:Patient's Body mass index is 39.48 kg/m?. : Will benefit with PCP follow-up, weight loss  healthy lifestyle and outpatient sleep evaluation. ? ? ?DVT prophylaxis: enoxaparin (LOVENOX) injection 40 mg Start: 03/12/22 1000 ?Code Status:   Code Status: Full Code ?Family Communication: plan of care discussed with patient at bedside. ?Patient status is: Inpatient level of care: Med-Surg  ?Remains inpatient because:  Ongoing management of IV antibiotics and drain care ?Patient currently not stable ? ?Dispo: The patient is from: Home ?           Anticipated disposition: Home likely tomorrow once okay with IR ? ?Mobility Assessment (last 72 hours)   ? ? Mobility Assessment   ? ? [place] Name 03/11/22 2050 03/11/22 0857 03/10/22 2109 03/10/22  1600 03/09/22 1959  ? Does patient have an order for bedrest or is patient medically unstable No - Continue assessment -- No - Continue assessment No - Continue assessment No - Continue assessment  ? What is the highest level of mobility based on the progressive mobility assessment? Level 5 (Walks with assist in room/hall) - Balance while stepping forward/back and can walk in room with assist - Complete Level 5 (Walks with assist in room/hall) - Balance while stepping forward/back and can walk in room with assist - Complete Level 4 (Walks with assist in room) - Balance while marching in place and cannot step forward and back - Complete Level 4 (Walks with assist in room) - Balance while marching in place and cannot step forward and back - Complete Level 4 (Walks with assist in room) - Balance while marching in place and cannot step forward and back - Complete  ? ?  ?  ? ?  ?  ? ?Objective: ?Vitals last 24 hrs: ?Vitals:  ? 03/11/22 1245 03/11/22 1306 03/11/22 2241 03/12/22 0549  ?BP: 133/76 139/74 (!) 155/59 (!) 131/55  ?Pulse: 65 75 79 71  ?Resp: '15 15 18 18  '$ ?Temp:  98.6 ?F (37 ?C) 98.6 ?F (37 ?C) 98.1 ?F (36.7 ?C)  ?TempSrc:  Oral Oral Oral  ?SpO2: 100% 94% 90% 93%  ?Weight:      ?Height:      ? ?Weight change:  ? ?Physical Examination: ?General exam: AA,older than stated age, weak appearing. ?HEENT:Oral mucosa moist, Ear/Nose WNL grossly, dentition normal. ?Respiratory system: bilaterally diminished BS, no use of accessory muscle ?Cardiovascular system: S1 & S2 +, No JVD,. ?Gastrointestinal system: Abdomen soft,NT,ND, BS+, ruq drain in place ?Nervous System:Alert, awake, moving extremities and grossly nonfocal ?Extremities: LE edema none,distal peripheral pulses palpable.  ?Skin: No rashes,no icterus. ?MSK: Normal muscle bulk,tone, power ? ?Medications reviewed:  ?Scheduled Meds: ? [START ON 03/13/2022] aspirin EC  81 mg Oral Daily  ? [START ON 03/13/2022] atorvastatin  40 mg Oral QHS  ? cholecalciferol  1,000  Units Oral Daily  ? citalopram  10 mg Oral QHS  ? enoxaparin (LOVENOX) injection  40 mg Subcutaneous Q24H  ? insulin aspart  0-9 Units Subcutaneous TID WC  ? metoprolol tartrate  50 mg Oral BID  ? multivitamin with minerals  1 tablet Oral Daily  ? pantoprazole  40 mg Oral Daily  ? sodium chloride flush  5 mL Intracatheter Q8H  ? ?Continuous Infusions: ? piperacillin-tazobactam (ZOSYN)  IV 3.375 g (03/12/22 0555)  ? ? ?  ?Diet Order   ? ?       ?  Diet Carb Modified Fluid consistency: Thin; Room service appropriate? Yes  Diet effective now       ?  ? ?  ?  ? ?  ?  ? ?  ?  ?  ? ? ?Intake/Output Summary (Last 24 hours) at 03/12/2022 1146 ?Last data filed at 03/12/2022 1000 ?Gross per 24 hour  ?Intake 1160 ml  ?Output 0 ml  ?Net 1160 ml  ? ?Net IO Since Admission: 1,491.18 mL [03/12/22 1146]  ?Wt Readings  from Last 3 Encounters:  ?03/09/22 101.1 kg  ?  ? ?Unresulted Labs (From admission, onward)  ? ?  Start     Ordered  ? 03/15/22 0500  Creatinine, serum  (enoxaparin (LOVENOX)    CrCl >/= 30 ml/min)  Weekly,   R     ?Comments: while on enoxaparin therapy ?  ? 03/13/2022 1744  ? ?  ?  ? ?  ?Data Reviewed: I have personally reviewed following labs and imaging studies ?CBC: ?Recent Labs  ?Lab 13-Mar-2022 ?1706 03/09/22 ?0110 03/10/22 ?0430 03/11/22 ?0423 03/12/22 ?9191  ?WBC 6.9 7.3 5.1 5.9 7.0  ?NEUTROABS 3.9  --  3.1 3.3 2.8  ?HGB 9.4* 9.5* 8.8* 9.1* 9.8*  ?HCT 30.0* 29.8* 28.7* 30.3* 32.0*  ?MCV 100.3* 99.3 100.3* 101.7* 100.6*  ?PLT 214 228 223 238 241  ? ?Basic Metabolic Panel: ?Recent Labs  ?Lab 03-13-22 ?1706 03/09/22 ?0110 03/10/22 ?0430 03/11/22 ?0423 03/12/22 ?6606  ?NA 139 138 138 142 140  ?K 4.1 3.6 4.3 3.5 3.9  ?CL 99 100 99 102 101  ?CO2 33* 32 '30 31 31  '$ ?GLUCOSE 104* 99 130* 131* 107*  ?BUN '13 11 13 13 9  '$ ?CREATININE 0.85 0.81 1.01* 1.25* 0.99  ?CALCIUM 8.5* 8.5* 8.0* 7.9* 8.0*  ? ?GFR: ?Estimated Creatinine Clearance: 54 mL/min (by C-G formula based on SCr of 0.99 mg/dL). ?Liver Function Tests: ?Recent Labs  ?Lab  03/13/2022 ?1706 03/09/22 ?0110 03/10/22 ?0430 03/11/22 ?0423 03/12/22 ?0045  ?AST 151* 123* 76* 53* 46*  ?ALT 78* 70* 51* 42 35  ?ALKPHOS 222* 214* 191* 152* 141*  ?BILITOT 1.5* 1.7* 1.0 0.6 0.7  ?PROT 8.1 8

## 2022-03-12 NOTE — Hospital Course (Addendum)
76 y.o. female with T2DM, GERD, HTN, recent cholecystectomy 3/28, apparently cholangiogram showed retained stones, presented with abdominal pain to [facility] 2 days PTA with abdominal pain. Work up, including an MRCP, revealed CBD dilation w/ concern for an obstructing stone. [facility] hospital requested transfer to [facility] d/t not having GI services available. ?She is s/p ERCP on 03/09/2022. The patient tolerated the procedure well. The patient underwent sphincterotomy with removal of stones and observation of significant flow.  ?Dr. Watt Climes has reviewed films from outside facility and notes that there is a question of abscess in the gallbladder fossa. This was discussed with IR and s/p percutaneous drainage CT guided drain 4/17 and being managed with IV antibiotics.  ?

## 2022-03-13 ENCOUNTER — Other Ambulatory Visit: Payer: Self-pay | Admitting: Internal Medicine

## 2022-03-13 DIAGNOSIS — K651 Peritoneal abscess: Secondary | ICD-10-CM

## 2022-03-13 LAB — COMPREHENSIVE METABOLIC PANEL
ALT: 29 U/L (ref 0–44)
AST: 46 U/L — ABNORMAL HIGH (ref 15–41)
Albumin: 2.3 g/dL — ABNORMAL LOW (ref 3.5–5.0)
Alkaline Phosphatase: 119 U/L (ref 38–126)
Anion gap: 7 (ref 5–15)
BUN: 7 mg/dL — ABNORMAL LOW (ref 8–23)
CO2: 31 mmol/L (ref 22–32)
Calcium: 7.9 mg/dL — ABNORMAL LOW (ref 8.9–10.3)
Chloride: 104 mmol/L (ref 98–111)
Creatinine, Ser: 1.07 mg/dL — ABNORMAL HIGH (ref 0.44–1.00)
GFR, Estimated: 53 mL/min — ABNORMAL LOW (ref >60)
Glucose, Bld: 110 mg/dL — ABNORMAL HIGH (ref 70–99)
Potassium: 4 mmol/L (ref 3.5–5.1)
Sodium: 142 mmol/L (ref 135–145)
Total Bilirubin: 1.7 mg/dL — ABNORMAL HIGH (ref 0.3–1.2)
Total Protein: 7.6 g/dL (ref 6.5–8.1)

## 2022-03-13 LAB — GLUCOSE, CAPILLARY
Glucose-Capillary: 102 mg/dL — ABNORMAL HIGH (ref 70–99)
Glucose-Capillary: 152 mg/dL — ABNORMAL HIGH (ref 70–99)

## 2022-03-13 MED ORDER — AMOXICILLIN-POT CLAVULANATE 875-125 MG PO TABS: 1.0000 | ORAL_TABLET | Freq: Two times a day (BID) | ORAL | 0 refills | Status: AC

## 2022-03-13 MED ORDER — SODIUM CHLORIDE 0.9 % IV SOLN: INTRAVENOUS | Status: DC | PRN

## 2022-03-13 NOTE — Progress Notes (Signed)
ID brief note ? ?Patient recent cholecystectomy 3/28 but developed gall bladder abscess ? ?Admitted 4/14 and had IR placement of perc drain on 4/17 ?Cx from drain growing e faecium S to ampicillin. Doing well ready for dispo ? ? ?A/p ?Gall bladder fossa abscess post cholecysctectomy s/p ir perc drain  ? ?Reasonable to send out on augmentin. Plan 4 weeks and can be evaluated in id clinic ?ID clinic appointment 5/03 @ 10 am with Dr Gale Journey ?Discussed with primary team ?

## 2022-03-13 NOTE — Progress Notes (Signed)
? ? ?Referring Physician(s): ?Swayze,A ? ?Supervising Physician: Corrie Mckusick ? ?[facility] ? ?Chief Complaint: ?Gallbladder fossa abscess ? ? ?Subjective: ?Pt doing fairly well this am; states she may go home today; denies fever/chills, N/V, worsening abd pain,N/V ? ? ?Allergies: ?Patient has no known allergies. ? ?Medications: ?Prior to Admission medications   ?Medication Sig Start Date End Date Taking? Authorizing Provider  ?albuterol (VENTOLIN HFA) 108 (90 Base) MCG/ACT inhaler Inhale 2 puffs into the lungs every 6 (six) hours as needed for wheezing or shortness of breath.   Yes [provider]  ?aspirin EC 81 MG tablet Take 81 mg by mouth in the morning. Swallow whole.   Yes [provider]  ?atorvastatin (LIPITOR) 40 MG tablet Take 40 mg by mouth at bedtime.   Yes [provider]  ?Cholecalciferol (VITAMIN D-3) 25 MCG (1000 UT) CAPS Take 1,000 Units by mouth in the morning.   Yes [provider]  ?citalopram (CELEXA) 10 MG tablet Take 10 mg by mouth at bedtime.   Yes [provider]  ?diazepam (VALIUM) 5 MG tablet Take 5 mg by mouth daily as needed for anxiety.   Yes [provider]  ?Garlic 0086 MG CAPS Take 1,000 mg by mouth in the morning.   Yes [provider]  ?hydrochlorothiazide (MICROZIDE) 12.5 MG capsule Take 12.5 mg by mouth in the morning.   Yes [provider]  ?lisinopril (ZESTRIL) 5 MG tablet Take 5 mg by mouth in the morning.   Yes [provider]  ?metoprolol tartrate (LOPRESSOR) 100 MG tablet Take 50 mg by mouth in the morning and at bedtime.   Yes [provider]  ?Multiple Vitamins-Minerals (ONE-A-DAY WOMENS 50+) TABS Take 1 tablet by mouth daily with breakfast.   Yes [provider]  ?niacin 500 MG tablet Take 500 mg by mouth in the morning.   Yes [provider]  ?Omega-3 Fatty Acids (FISH OIL) 1200 MG CAPS Take 1,200 mg by mouth in the morning and at bedtime.    Yes [provider]  ?omeprazole (PRILOSEC) 10 MG capsule Take 10 mg by mouth daily before breakfast.   Yes [provider]  ?pioglitazone (ACTOS) 30 MG tablet Take 30 mg by mouth in the morning.   Yes [provider]  ?Semaglutide,0.25 or 0.'5MG'$ /DOS, (OZEMPIC, 0.25 OR 0.5 MG/DOSE,) 2 MG/1.5ML SOPN Inject 0.25 mg into the skin every Saturday. ?Patient not taking: Reported on 03/08/2022    [provider]  ? ? ? ?Vital Signs: ?BP (!) 160/61 (BP Location: Right Arm)   Pulse 70   Temp 97.9 ?F (36.6 ?C) (Oral)   Resp 18   Ht '5\' 3"'$  (1.6 m)   Wt 222 lb 14.2 oz (101.1 kg)   SpO2 94%   BMI 39.48 kg/m?  ? ?Physical Exam awake/alert; GB fossa drain intact, site not sig tender, output about 10 cc brown fluid today; 50 cc yesterday; drain flushed without difficulty ? ?Imaging: ?CT IMAGE GUIDED DRAINAGE BY PERCUTANEOUS CATHETER ? ?Result Date: 03/11/2022 ?CLINICAL DATA:  Abscess in gallbladder fossa EXAM: CT GUIDED DRAINAGE OF GALLBLADDER FOSSA ABSCESS ANESTHESIA/SEDATION: Intravenous Fentanyl 176mg and Versed '2mg'$  were administered as conscious sedation during continuous monitoring of the patient's level of consciousness and physiological / cardiorespiratory status by the radiology RN, with a total moderate sedation time of 19 minutes. PROCEDURE: The procedure, risks, benefits, and alternatives were explained to the patient. Questions regarding the procedure were encouraged and answered. The patient understands  and consents to the procedure. Select axial scans through the upper abdomen were obtained. An appropriate skin entry site was determined and marked. The operative field was prepped with chlorhexidinein a sterile fashion, and a sterile drape was applied covering the operative field. A sterile gown and sterile gloves were used for the procedure. Local anesthesia was provided with 1% Lidocaine. 41 gauge trocar needle was advanced into the subhepatic collection using trans pedicle  approach taking care to avoid the colon. An Amplatz guidewire formed within the collection. Tract dilated to facilitate placement of a 10 French pigtail drain catheter, formed centrally within the collection. Thick opaque clot-like material could be aspirated, sample sent for Gram stain and culture. Catheter secured with 0 Prolene suture and StatLock and placed to gravity drain bag. The patient tolerated the procedure well. COMPLICATIONS: None immediate FINDINGS: Loculated gas and fluid collection in the gallbladder fossa was localized. 10 French pigtail drain catheter placed as above. 5 mL of thick aspirate sent for Gram stain and culture. IMPRESSION: 1. Technically successful CT-guided abscess drain catheter placement, gallbladder fossa. RADIATION DOSE REDUCTION: This exam was performed according to the departmental dose-optimization program which includes automated exposure control, adjustment of the mA and/or kV according to patient size and/or use of iterative reconstruction technique. Electronically Signed   By: Lucrezia Europe M.D.   On: 03/11/2022 14:45   ? ?Labs: ? ?CBC: ?Recent Labs  ?  03/09/22 ?0110 03/10/22 ?0430 03/11/22 ?0423 03/12/22 ?0428  ?WBC 7.3 5.1 5.9 7.0  ?HGB 9.5* 8.8* 9.1* 9.8*  ?HCT 29.8* 28.7* 30.3* 32.0*  ?PLT 228 223 238 241  ? ? ?COAGS: ?No results for input(s): INR, APTT in the last 8760 hours. ? ?BMP: ?Recent Labs  ?  03/10/22 ?0430 03/11/22 ?0423 03/12/22 ?0428 03/13/22 ?5176  ?NA 138 142 140 142  ?K 4.3 3.5 3.9 4.0  ?CL 99 102 101 104  ?CO2 '30 31 31 31  '$ ?GLUCOSE 130* 131* 107* 110*  ?BUN '13 13 9 '$ 7*  ?CALCIUM 8.0* 7.9* 8.0* 7.9*  ?CREATININE 1.01* 1.25* 0.99 1.07*  ?GFRNONAA 57* 44* 59* 53*  ? ? ?LIVER FUNCTION TESTS: ?Recent Labs  ?  03/10/22 ?0430 03/11/22 ?0423 03/12/22 ?0428 03/13/22 ?0454  ?BILITOT 1.0 0.6 0.7 1.7*  ?AST 76* 53* 46* 46*  ?ALT 51* 42 35 29  ?ALKPHOS 191* 152* 141* 119  ?PROT 7.7 7.5 8.1 7.6  ?ALBUMIN 2.4* 2.4* 2.5* 2.3*  ? ? ?Assessment and Plan: ?Pt with hx  cholecystectomy on 02/19/22, choledocholithiasis; now with post op GB fossa abscess; s/p 10 fr drain placement to bag on 4/17; afebrile, creat 1.07, fluid cx- mod enterococcus; as outpatient rec once daily flush of drain with 5 cc sterile saline, output recording and dressing change every 2-3 days; pt's daughter aware of how to flush drain and given prescription for saline flushes;  pt will be scheduled for f/u in IR drain clinic in 2 weeks; pt can call [phone] with any drain related questions ? ? ?Electronically Signed: ?Autumn Messing, PA-C ?03/13/2022, 10:57 AM ? ? ?I spent a total of 15 Minutes at the the patient's bedside AND on the patient's hospital floor or unit, greater than 50% of which was counseling/coordinating care for gallbladder fossa abscess drain ? ? ? ?Patient ID: Tricia Butler, female   DOB: [birth year], 76 y.o.   MRN: [id number] ? ?

## 2022-03-13 NOTE — Discharge Summary (Signed)
Physician Discharge Summary  ?Tricia Butler LOV:[id number] DOB: Feb 21, 1946 DOA: 03/08/2022 ? ?PCP: System, Provider Not In ? ?Admit date: 03/08/2022 ?Discharge date: 03/13/2022 ?Recommendations for Outpatient Follow-up:  ?Follow up with PCP in 1 weeks-call for appointment ?Please obtain BMP/CBC in one week ? ?Discharge Dispo: home ?Discharge Condition: Stable ?Code Status:   Code Status: Full Code ?Diet recommendation:  ?Diet Order   ? ?       ?  Diet Carb Modified Fluid consistency: Thin; Room service appropriate? Yes  Diet effective now       ?  ? ?  ?  ? ?  ?  ? ?Brief/Interim Summary: ?76 y.o. female with T2DM, GERD, HTN, recent cholecystectomy 3/28, apparently cholangiogram showed retained stones, presented with abdominal pain to [facility] 2 days PTA with abdominal pain. Work up, including an MRCP, revealed CBD dilation w/ concern for an obstructing stone. [facility] hospital requested transfer to [facility] d/t not having GI services available. ?She is s/p ERCP on 03/09/2022. The patient tolerated the procedure well. The patient underwent sphincterotomy with removal of stones and observation of significant flow.  ?Dr. Watt Climes has reviewed films from outside facility and notes that there is a question of abscess in the gallbladder fossa. This was discussed with IR and s/p percutaneous drainage CT guided drain 4/17 and being managed with IV antibiotics.   follow-up drain culture  showed GPC-Enterococcus faecium-sensitive to ampicillin. Discussed with Dr Johnny Bridge- plan for 4 wks of augmentin and he will see her on may 3 , 10 am [facility]. ? ?Discharge Diagnoses:  ?Principal Problem: ?  Choledocholithiasis ?Active Problems: ?  HTN (hypertension) ?  Elevated LFTs ?  DM2 (diabetes mellitus, type 2) ([place]) ?  GERD (gastroesophageal reflux disease) ?  Macrocytic anemia ?  Abscess of abdominal cavity ([place]) ?  Obesity, Class II, BMI 35-39.9 ? ?Choledocholithiasis ?Gall bladder fossa abscess/abdominal abscess 2/2  Enterococcus faecium:  ?Recent cholecystectomy 3/28.  Presenting with abdominal pain,MRCP w/ CBD dilatation-s/p ERCP on 03/09/2022. S/p CT-guided drainage 4/17, follow-up drain culture  showed GPC-Enterococcus faecium-sensitive to ampicillin.  Discussed with Dr Johnny Bridge- plan for 4 wks of augmentin and he will see her on may 3 , 10 am [facility]. she will follow-up with IR for continued drain care as he has been discharged with drain.  Discussed with Lennette Bihari from IR.   ? ?Drain Care: ?Continue TID flushes with 5 cc NS. ?Record output Q shift. ?Dressing changes QD or PRN if soiled.  ?Call IR APP or on call IR MD if difficulty flushing or sudden change in drain output.  ?Repeat imaging/possible drain injection once output < 10 mL/QD (excluding flush material.) ?  ?Discharge planning: ?Typically patient will follow up with IR clinic 10-14 days post d/c for repeat imaging/possible drain injection. IR scheduler will contact patient with date/time of appointment. Patient will need to flush drain QD with 5 cc NS, record output QD, dressing changes every 2-3 days or earlier if soiled.  ?  ?HTN : BP is now uptrending 160s this morning -on d/c resume home lisinopril. Cont to hold HCTZ, cont home metoprolol-and follow-up with PCP to adjust meds further ?HLD: cont statin and aspirin  ?Elevated LFTs-in the setting of #1, trend.  Improving. ?Recent Labs  ?Lab 03/09/22 ?0110 03/10/22 ?0430 03/11/22 ?0423 03/12/22 ?8841 03/13/22 ?6606  ?AST 123* 76* 53* 46* 46*  ?ALT 70* 51* 42 35 29  ?ALKPHOS 214* 191* 152* 141* 119  ?BILITOT 1.7* 1.0 0.6 0.7 1.7*  ?  PROT 8.0 7.7 7.5 8.1 7.6  ?ALBUMIN 2.5* 2.4* 2.4* 2.5* 2.3*  ?  ?DM2: Blood sugar controlled-monitor CBG at home and resume home meds Actos. ?GERD: Continue PPI ?  ?Macrocytic anemia: Hemoglobin holding 9 g range.  Monitor ?Class II Obesity:Patient's Body mass index is 39.48 kg/m?. : Will benefit with PCP follow-up, weight loss  healthy lifestyle and outpatient sleep evaluation. ? ?  Consults: ?IR  ?GI ?Subjective: ?Alert awake oriented this morning, anxious and wants to go home today ? ?Discharge Exam: ?Vitals:  ? 03/12/22 2136 03/13/22 0630  ?BP: (!) 145/50 (!) 160/61  ?Pulse: 71 70  ?Resp: 18 18  ?Temp: 97.7 ?F (36.5 ?C) 97.9 ?F (36.6 ?C)  ?SpO2: 92% 94%  ? ?General: Pt is alert, awake, not in acute distress ?Cardiovascular: RRR, S1/S2 +, no rubs, no gallops ?Respiratory: CTA bilaterally, no wheezing, no rhonchi ?Abdominal: Soft, NT, ND, bowel sounds + ?Extremities: no edema, no cyanosis ? ?Discharge Instructions ? ?Discharge Instructions   ? ? Discharge instructions   Complete by: As directed ?  ? Typically patient will follow up with IR clinic 10-14 days post d/c for repeat imaging/possible drain injection. IR scheduler will contact patient with date/time of appointment. Patient will need to flush drain QD with 5 cc NS, record output QD, dressing changes every 2-3 days or earlier if soiled.  ? ?.  Follow-up with PCP in a week, or GI doctor endocrinology as advised. ? ?Please call call MD or return to ER for similar or worsening recurring problem that brought you to hospital or if any fever,nausea/vomiting,abdominal pain, uncontrolled pain, chest pain,  shortness of breath or any other alarming symptoms. ? ?Please follow-up your doctor as instructed in a week time and call the office for appointment. ? ?Please avoid alcohol, smoking, or any other illicit substance and maintain healthy habits including taking your regular medications as prescribed. ? ?You were cared for by a hospitalist during your hospital stay. If you have any questions about your discharge medications or the care you received while you were in the hospital after you are discharged, you can call the unit and ask to speak with the hospitalist on call if the hospitalist that took care of you is not available. ? ?Once you are discharged, your primary care physician will handle any further medical issues. Please note that NO  REFILLS for any discharge medications will be authorized once you are discharged, as it is imperative that you return to your primary care physician (or establish a relationship with a primary care physician if you do not have one) for your aftercare needs so that they can reassess your need for medications and monitor your lab values  ? Discharge wound care:   Complete by: As directed ?  ? Dressing changes every 2-3 days or earlier if soiled.  ? Increase activity slowly   Complete by: As directed ?  ? ?  ? ?Allergies as of 03/13/2022   ?No Known Allergies ?  ? ?  ?Medication List  ?  ? ?STOP taking these medications   ? ?hydrochlorothiazide 12.5 MG capsule ?Commonly known as: MICROZIDE ?  ? ?  ? ?TAKE these medications   ? ?albuterol 108 (90 Base) MCG/ACT inhaler ?Commonly known as: VENTOLIN HFA ?Inhale 2 puffs into the lungs every 6 (six) hours as needed for wheezing or shortness of breath. ?  ?amoxicillin-clavulanate 875-125 MG tablet ?Commonly known as: Augmentin ?Take 1 tablet by mouth 2 (two) times daily for 28 days. ?  ?  aspirin EC 81 MG tablet ?Take 81 mg by mouth in the morning. Swallow whole. ?  ?atorvastatin 40 MG tablet ?Commonly known as: LIPITOR ?Take 40 mg by mouth at bedtime. ?  ?citalopram 10 MG tablet ?Commonly known as: CELEXA ?Take 10 mg by mouth at bedtime. ?  ?diazepam 5 MG tablet ?Commonly known as: VALIUM ?Take 5 mg by mouth daily as needed for anxiety. ?  ?Fish Oil 1200 MG Caps ?Take 1,200 mg by mouth in the morning and at bedtime. ?  ?Garlic 0932 MG Caps ?Take 1,000 mg by mouth in the morning. ?  ?lisinopril 5 MG tablet ?Commonly known as: ZESTRIL ?Take 5 mg by mouth in the morning. ?  ?metoprolol tartrate 100 MG tablet ?Commonly known as: LOPRESSOR ?Take 50 mg by mouth in the morning and at bedtime. ?  ?niacin 500 MG tablet ?Take 500 mg by mouth in the morning. ?  ?omeprazole 10 MG capsule ?Commonly known as: PRILOSEC ?Take 10 mg by mouth daily before breakfast. ?  ?One-A-Day Womens 50+  Tabs ?Take 1 tablet by mouth daily with breakfast. ?  ?Ozempic (0.25 or 0.5 MG/DOSE) 2 MG/1.5ML Sopn ?Generic drug: Semaglutide(0.25 or 0.'5MG'$ /DOS) ?Inject 0.25 mg into the skin every Saturday. ?  ?pioglitazone 30 MG

## 2022-03-13 NOTE — Discharge Instructions (Signed)
Please flush right upper abdominal drain with 5 cc sterile saline once daily, record drain output and change dressing every 2-3 days; call [phone] with any drain related questions; radiology team will call you with follow up appt in 2 weeks ?

## 2022-03-15 LAB — CULTURE, BLOOD (ROUTINE X 2)
Culture: NO GROWTH
Culture: NO GROWTH
Special Requests: ADEQUATE
Special Requests: ADEQUATE

## 2022-03-16 LAB — AEROBIC/ANAEROBIC CULTURE W GRAM STAIN (SURGICAL/DEEP WOUND)

## 2022-03-18 DIAGNOSIS — R69 Illness, unspecified: Secondary | ICD-10-CM | POA: Diagnosis not present

## 2022-03-18 DIAGNOSIS — E119 Type 2 diabetes mellitus without complications: Secondary | ICD-10-CM | POA: Diagnosis not present

## 2022-03-18 DIAGNOSIS — E1165 Type 2 diabetes mellitus with hyperglycemia: Secondary | ICD-10-CM | POA: Diagnosis not present

## 2022-03-18 DIAGNOSIS — E1140 Type 2 diabetes mellitus with diabetic neuropathy, unspecified: Secondary | ICD-10-CM | POA: Diagnosis not present

## 2022-03-18 DIAGNOSIS — E782 Mixed hyperlipidemia: Secondary | ICD-10-CM | POA: Diagnosis not present

## 2022-03-18 DIAGNOSIS — E038 Other specified hypothyroidism: Secondary | ICD-10-CM | POA: Diagnosis not present

## 2022-03-18 DIAGNOSIS — E559 Vitamin D deficiency, unspecified: Secondary | ICD-10-CM | POA: Diagnosis not present

## 2022-03-18 DIAGNOSIS — D518 Other vitamin B12 deficiency anemias: Secondary | ICD-10-CM | POA: Diagnosis not present

## 2022-03-18 DIAGNOSIS — I10 Essential (primary) hypertension: Secondary | ICD-10-CM | POA: Diagnosis not present

## 2022-03-22 DIAGNOSIS — R69 Illness, unspecified: Secondary | ICD-10-CM | POA: Diagnosis not present

## 2022-03-22 DIAGNOSIS — E119 Type 2 diabetes mellitus without complications: Secondary | ICD-10-CM | POA: Diagnosis not present

## 2022-03-22 DIAGNOSIS — E1165 Type 2 diabetes mellitus with hyperglycemia: Secondary | ICD-10-CM | POA: Diagnosis not present

## 2022-03-22 DIAGNOSIS — E782 Mixed hyperlipidemia: Secondary | ICD-10-CM | POA: Diagnosis not present

## 2022-03-22 DIAGNOSIS — K219 Gastro-esophageal reflux disease without esophagitis: Secondary | ICD-10-CM | POA: Diagnosis not present

## 2022-03-22 DIAGNOSIS — I10 Essential (primary) hypertension: Secondary | ICD-10-CM | POA: Diagnosis not present

## 2022-03-22 DIAGNOSIS — Z0000 Encounter for general adult medical examination without abnormal findings: Secondary | ICD-10-CM | POA: Diagnosis not present

## 2022-03-25 DIAGNOSIS — E119 Type 2 diabetes mellitus without complications: Secondary | ICD-10-CM | POA: Diagnosis not present

## 2022-03-25 DIAGNOSIS — I10 Essential (primary) hypertension: Secondary | ICD-10-CM | POA: Diagnosis not present

## 2022-03-25 DIAGNOSIS — E038 Other specified hypothyroidism: Secondary | ICD-10-CM | POA: Diagnosis not present

## 2022-03-25 DIAGNOSIS — E1140 Type 2 diabetes mellitus with diabetic neuropathy, unspecified: Secondary | ICD-10-CM | POA: Diagnosis not present

## 2022-03-25 DIAGNOSIS — E559 Vitamin D deficiency, unspecified: Secondary | ICD-10-CM | POA: Diagnosis not present

## 2022-03-25 DIAGNOSIS — E782 Mixed hyperlipidemia: Secondary | ICD-10-CM | POA: Diagnosis not present

## 2022-03-25 DIAGNOSIS — D518 Other vitamin B12 deficiency anemias: Secondary | ICD-10-CM | POA: Diagnosis not present

## 2022-03-25 DIAGNOSIS — E1165 Type 2 diabetes mellitus with hyperglycemia: Secondary | ICD-10-CM | POA: Diagnosis not present

## 2022-03-25 DIAGNOSIS — R69 Illness, unspecified: Secondary | ICD-10-CM | POA: Diagnosis not present

## 2022-03-27 ENCOUNTER — Ambulatory Visit
Admission: RE | Admit: 2022-03-27 | Discharge: 2022-03-27 | Disposition: A | Discharge status: Home / Self Care | Payer: Medicare HMO | Source: Ambulatory Visit | Attending: Internal Medicine | Admitting: Internal Medicine

## 2022-03-27 ENCOUNTER — Encounter: Payer: Self-pay | Admitting: Internal Medicine

## 2022-03-27 ENCOUNTER — Ambulatory Visit
Admission: RE | Admit: 2022-03-27 | Discharge: 2022-03-27 | Disposition: A | Discharge status: Home / Self Care | Payer: Medicare HMO | Source: Ambulatory Visit | Attending: Radiology | Admitting: Radiology

## 2022-03-27 ENCOUNTER — Other Ambulatory Visit: Payer: Self-pay | Admitting: Internal Medicine

## 2022-03-27 ENCOUNTER — Ambulatory Visit: Payer: Medicare HMO | Admitting: Internal Medicine

## 2022-03-27 ENCOUNTER — Other Ambulatory Visit: Payer: Self-pay

## 2022-03-27 VITALS — BP 174/79 | HR 59 | Temp 97.1°F | Wt 226.0 lb

## 2022-03-27 DIAGNOSIS — K651 Peritoneal abscess: Secondary | ICD-10-CM

## 2022-03-27 DIAGNOSIS — K750 Abscess of liver: Secondary | ICD-10-CM | POA: Diagnosis not present

## 2022-03-27 DIAGNOSIS — K8020 Calculus of gallbladder without cholecystitis without obstruction: Secondary | ICD-10-CM

## 2022-03-27 DIAGNOSIS — K810 Acute cholecystitis: Secondary | ICD-10-CM | POA: Diagnosis not present

## 2022-03-27 DIAGNOSIS — K828 Other specified diseases of gallbladder: Secondary | ICD-10-CM | POA: Diagnosis not present

## 2022-03-27 DIAGNOSIS — Z4682 Encounter for fitting and adjustment of non-vascular catheter: Secondary | ICD-10-CM | POA: Diagnosis not present

## 2022-03-27 DIAGNOSIS — K819 Cholecystitis, unspecified: Secondary | ICD-10-CM | POA: Diagnosis not present

## 2022-03-27 DIAGNOSIS — K6811 Postprocedural retroperitoneal abscess: Secondary | ICD-10-CM | POA: Diagnosis not present

## 2022-03-27 HISTORY — PX: IR RADIOLOGIST EVAL & MGMT: IMG5224

## 2022-03-27 MED ORDER — IOPAMIDOL (ISOVUE-300) INJECTION 61%
100.0000 mL | Freq: Once | INTRAVENOUS | Status: AC | PRN
  Administered 2022-03-27: 100 mL via INTRAVENOUS

## 2022-03-27 NOTE — Progress Notes (Signed)
? ?Referring Physician(s): ?Dr. Clarene Essex ? ?Chief Complaint: ?The patient is seen in follow up today s/p gall bladder fossa abscess ? ?History of present illness: ?Tricia Butler is a 76 year female with history of DM 2, GERD, HTN and microcytic anemia.  Patient presented to to [facility]07/[facility]22 as a transfer from [facility] due to not having GI capability. Patient presented to [facility] 2 days prior complaining of abdominal pain status post cholecystectomy on 02/19/2022.  Patient had imaging at outside facility that noted questionable abscess in gallbladder fossa as well as CBD stones.  She underwent ERCP with Dr. Clarene Essex for stone removal 4/15, the subsequently underwent gall bladder fossa drain placement 03/11/22 by Dr. Vernard Gambles.  She was discharged home on abx with IR drain clinic follow-up.  ? ?Patient presents to IR drain clinic today feeling much improved.  States she is eating and drinking well.  Denies fevers, chills, abdominal pain, nausea, vomiting. Reports her drain has very little output.  She is accompanied by her daughter today who states she is flushing the drain daily without issue.   ? ?Past Medical History:  ?Diagnosis Date  ? Diabetes mellitus without complication ([place])   ? GERD (gastroesophageal reflux disease)   ? Hypertension   ? ? ?Past Surgical History:  ?Procedure Laterality Date  ? ANKLE FRACTURE SURGERY    ? ERCP N/A 03/09/2022  ? Procedure: ENDOSCOPIC RETROGRADE CHOLANGIOPANCREATOGRAPHY (ERCP);  Surgeon: Clarene Essex, MD;  Location: [facility];  Service: Gastroenterology;  Laterality: N/A;  ? REMOVAL OF STONES  03/09/2022  ? Procedure: REMOVAL OF STONES;  Surgeon: Clarene Essex, MD;  Location: [facility];  Service: Gastroenterology;;  ? SPHINCTEROTOMY  03/09/2022  ? Procedure: SPHINCTEROTOMY;  Surgeon: Clarene Essex, MD;  Location: [facility];  Service: Gastroenterology;;  ? ? ?Allergies: ?Ibuprofen ? ?Medications: ?Prior to Admission medications   ?Medication Sig Start  Date End Date Taking? Authorizing Provider  ?albuterol (VENTOLIN HFA) 108 (90 Base) MCG/ACT inhaler Inhale 2 puffs into the lungs every 6 (six) hours as needed for wheezing or shortness of breath.    [provider]  ?amoxicillin-clavulanate (AUGMENTIN) 875-125 MG tablet Take 1 tablet by mouth 2 (two) times daily for 28 days. 03/13/22 04/10/22  Antonieta Pert, MD  ?aspirin EC 81 MG tablet Take 81 mg by mouth in the morning. Swallow whole.    [provider]  ?atorvastatin (LIPITOR) 40 MG tablet Take 40 mg by mouth at bedtime.    [provider]  ?Cholecalciferol (VITAMIN D-3) 25 MCG (1000 UT) CAPS Take 1,000 Units by mouth in the morning.    [provider]  ?citalopram (CELEXA) 10 MG tablet Take 10 mg by mouth at bedtime.    [provider]  ?diazepam (VALIUM) 5 MG tablet Take 5 mg by mouth daily as needed for anxiety.    [provider]  ?Garlic 6378 MG CAPS Take 1,000 mg by mouth in the morning.    [provider]  ?lisinopril (ZESTRIL) 5 MG tablet Take 5 mg by mouth in the morning.    [provider]  ?metoprolol tartrate (LOPRESSOR) 100 MG tablet Take 50 mg by mouth in the morning and at bedtime.    [provider]  ?Multiple Vitamins-Minerals (ONE-A-DAY WOMENS 50+) TABS Take 1 tablet by mouth daily with breakfast.    [provider]  ?niacin 500 MG tablet Take 500 mg by mouth in the morning.    [provider]  ?Omega-3 Fatty Acids ([place]  OIL) 1200 MG CAPS Take 1,200 mg by mouth in the morning and at bedtime.    [provider]  ?omeprazole (PRILOSEC) 10 MG capsule Take 10 mg by mouth daily before breakfast.    [provider]  ?pioglitazone (ACTOS) 30 MG tablet Take 30 mg by mouth in the morning.    [provider]  ?Semaglutide,0.25 or 0.'5MG'$ /DOS, (OZEMPIC, 0.25 OR 0.5 MG/DOSE,) 2 MG/1.5ML SOPN Inject 0.25 mg into the skin every Saturday. ?Patient not taking: Reported on 03/27/2022    [provider]  ?  ? ?No family history on file. ? ?Social History  ? ?Socioeconomic History  ? Marital status: Married  ?  Spouse name: Not on file  ? Number of children: Not on file  ? Years of education: Not on file  ? Highest education level: Not on file  ?Occupational History  ? Not on file  ?Tobacco Use  ? Smoking status: Never  ? Smokeless tobacco: Never  ?Vaping Use  ? Vaping Use: Never used  ?Substance and Sexual Activity  ? Alcohol use: Never  ? Drug use: Never  ? Sexual activity: Not Currently  ?Other Topics Concern  ? Not on file  ?Social History Narrative  ? ** Merged History Encounter **  ?    ? ?Social Determinants of Health  ? ?Financial Resource Strain: Not on file  ?Food Insecurity: Not on file  ?Transportation Needs: Not on file  ?Physical Activity: Not on file  ?Stress: Not on file  ?Social Connections: Not on file  ? ? ? ?Vital Signs: ?There were no vitals taken for this visit. ? ?Physical Exam ?NAD, alert ?Abdomen: soft, non-tender.  Drain insertion site c/d/I.  Trace output in gravity bag. Flushes easily.  Aspiration yield thick, dark colored fluid with debris.  ? ?Imaging: ?CT ABDOMEN PELVIS W CONTRAST ? ?Result Date: 03/27/2022 ?CLINICAL DATA:  s/p gallbladder fossa abscess drain 4/17 EXAM: CT ABDOMEN AND PELVIS WITH CONTRAST TECHNIQUE: Multidetector CT imaging of the abdomen and pelvis was performed using the standard protocol following bolus administration of intravenous contrast. RADIATION DOSE REDUCTION: This exam was performed according to the departmental dose-optimization program which includes automated exposure control, adjustment of the mA and/or kV according to patient size and/or use of iterative reconstruction technique. CONTRAST:  132m ISOVUE-300 IOPAMIDOL (ISOVUE-300) INJECTION 61% COMPARISON:  ERCP, 03/09/2022.  IR CT, 03/11/2021. Patient's comparison imaging under a different MRN KMARTENA, EMANUELEDOB 805-11-1947PID [id number] [facility] VirginiaFINDINGS: Lower chest: No acute abnormality.  Hepatobiliary: No biliary dilatation. Status post cholecystectomy. Transhepatic drainage catheter with pigtail tip well-positioned within the gallbladder fossa. Persistent air-and-fluid collection measuring up to 3.5 x 5.0 x 4.0 cm (AP by transaxial by CC) Pancreas: No pancreatic ductal dilatation or surrounding inflammatory changes. Spleen: Normal in size without focal abnormality. Adrenals/Urinary Tract: Adrenal glands are unremarkable. Bilateral exophytic hypodense renal lesions, largest on RIGHT and measuring up to 5.0 cm, likely consistent with cysts. Kidneys are otherwise normal, without renal calculi or hydronephrosis. Bladder is unremarkable. Stomach/Bowel: Stomach is within normal limits. Appendix is not definitively visualized. Nonobstructed small bowel. Nondilated colon. A severe burden of sigmoid diverticulosis is present. No evidence of bowel wall thickening, distention, or inflammatory changes. Vascular/Lymphatic: Aortic atherosclerosis. No enlarged abdominal or pelvic lymph nodes. Reproductive: Uterus and adnexa are unremarkable. Other: No abdominopelvic ascites. Small fat-containing umbilical hernia. Musculoskeletal: Degenerative changes of spine. No acute or significant osseous findings. No follow-up is indicated for incidental findings, unless specifically mentioned. IMPRESSION: Persistent gallbladder fossa abscess  with a well-positioned transhepatic drainage catheter. If continued drainage catheter output, findings are suspicious for cystic duct stump leak. Michaelle Birks, MD [facility] Electronically Signed   By: Michaelle Birks M.D.   On: 03/27/2022 14:28   ? ?Labs: ? ?CBC: ?Recent Labs  ?  03/09/22 ?0110 03/10/22 ?0430 03/11/22 ?0423 03/12/22 ?0428  ?WBC 7.3 5.1 5.9 7.0  ?HGB 9.5* 8.8* 9.1* 9.8*  ?HCT 29.8* 28.7* 30.3* 32.0*  ?PLT 228 223 238 241  ? ? ?COAGS: ?No results for input(s): INR, APTT in the last 8760 hours. ? ?BMP: ?Recent Labs  ?   03/10/22 ?0430 03/11/22 ?0423 03/12/22 ?0428 03/13/22 ?7639  ?NA 138 142 140 142  ?K 4.3 3.5 3.9 4.0  ?CL 99 102 101 104  ?CO2 '30 31 31 31  '$ ?GLUCOSE 130* 131* 107* 110*  ?BUN '13 13 9 '$ 7*  ?CALCIUM 8.0* 7.9

## 2022-03-27 NOTE — Patient Instructions (Signed)
Once your drain is removed we plan stop stop abx in 1-2 weeks after that ? ?Video visit with me in around 2 weeks ? ?Labs today ?

## 2022-03-27 NOTE — Progress Notes (Signed)
?  ? ? ? ? ?[facility] Infectious Disease ? ?Reason for Consult:cholelithiasis/gallbladder fossa abscess ?Referring Provider: Antonieta Pert ? ? ? ?Patient Active Problem List  ? Diagnosis Date Noted  ? Abscess of abdominal cavity ([place]) 03/12/2022  ? Obesity, Class II, BMI 35-39.9 03/12/2022  ? Choledocholithiasis 03/08/2022  ? HTN (hypertension) 03/08/2022  ? Elevated LFTs 03/08/2022  ? DM2 (diabetes mellitus, type 2) ([place]) 03/08/2022  ? GERD (gastroesophageal reflux disease) 03/08/2022  ? Macrocytic anemia 03/08/2022  ? ? ? ? ?HPI: Tricia Butler is a 76 y.o. female dm2, hx of gall bladder stone, here for new patient evaluation of gall bladder abscess ? ?She was admitted 4/14-19 with epigastric pain and sepsis found to have gallbladder abscess and cbd stone. She had a percutaneous drain placed by IR. Put on abx and will follow up with surgery ? ?She had ercp and several stones were retrieved ? ?Reviewed chart/micro ? ?She is feeling completely normal today. Eating well ?No f/c/diarrhea ? ?The drain output is much decreased. With the saline flush, she could get less than 5 cc a day for since hospital discharge. Greenish material/liquid  ? ?She'll see IR today and general surgery next week ? ?She is taking augmentin and probiotics. Her drain culture previously grew e faecium and e gallinarum that is sensitive to ampicillin ? ?She is here with her daughter. She walks with fww (chronic) ? ?Review of Systems: ?ROS ?All other ros negative ? ? ? ? ? ?Past Medical History:  ?Diagnosis Date  ? Diabetes mellitus without complication ([place])   ? GERD (gastroesophageal reflux disease)   ? Hypertension   ? ? ?Social History  ? ?Tobacco Use  ? Smoking status: Never  ? Smokeless tobacco: Never  ?Vaping Use  ? Vaping Use: Never used  ?Substance Use Topics  ? Alcohol use: Never  ? Drug use: Never  ? ? ?Family hx: ?Daughter has gall bladder stone ? ?Allergies  ?Allergen Reactions  ? Ibuprofen Other (See Comments)  ?  Pt reports  nosebleed  ? ? ?OBJECTIVE: ?Vitals:  ? 03/27/22 0940  ?BP: (!) 174/79  ?Pulse: (!) 59  ?Temp: (!) 97.1 ?F (36.2 ?C)  ?TempSrc: Temporal  ?Weight: 226 lb (102.5 kg)  ? ?Body mass index is 40.03 kg/m?. ? ? ?Physical Exam ?General/constitutional: no distress, pleasant ?HEENT: Normocephalic, PER, Conj Clear, EOMI, Oropharynx clear ?Neck supple ?CV: rrr no mrg ?Lungs: clear to auscultation, normal respiratory effort ?Abd: Soft, Nontender --  percutaneous drain with catheter bag empty, but green liquid seen in collection tubing ?Ext: no edema ?Skin: No Rash ?Neuro: nonfocal ?MSK: no peripheral joint swelling/tenderness/warmth; back spines nontender ? ? ?Lab: ?Lab Results  ?Component Value Date  ? WBC 7.0 03/12/2022  ? HGB 9.8 (L) 03/12/2022  ? HCT 32.0 (L) 03/12/2022  ? MCV 100.6 (H) 03/12/2022  ? PLT 241 03/12/2022  ? ? ?Microbiology: ? ?Serology: ? ?Imaging: ? ? ?Assessment/plan: ?Problem List Items Addressed This Visit   ?None ?Visit Diagnoses   ? ? Intra-abdominal abscess ([place])    -  Primary  ? Gall bladder stones      ? Cholecystitis      ? ?  ? ? ? ? ?Cholecystitis ?Gall bladder/cbd stones ?Gall bladder fossa abscess ? ? ?S/p ercp 4/15  ?S/p perc drain placement 4/17 ? ?4/15 bcx negative ?4/17 drain fluid cx e faecium and gallinarum S amp. Improved on augmentin ? ? ?-f/u IR and gen surg ?-continue antibiotics for now ?-once drain  removed plan another 1-2 weeks abx ?-video visit in 2 weeks ?-labs to assess lft, abx toxicity ? ? ? ? ? ?Follow-up: No follow-ups on file. ? ? ?I have spent a total of 65 minutes of face-to-face and non-face-to-face time, excluding clinical staff time, preparing to see patient, ordering tests and/or medications, and provide counseling the patient  ? ? ?Jabier Mutton, MD ?[facility] ?[facility] ?03/27/2022, 10:01 AM ? ?

## 2022-03-28 LAB — COMPLETE METABOLIC PANEL WITH GFR
AG Ratio: 0.7 (calc) — ABNORMAL LOW (ref 1.0–2.5)
ALT: 11 U/L (ref 6–29)
AST: 22 U/L (ref 10–35)
Albumin: 3.4 g/dL — ABNORMAL LOW (ref 3.6–5.1)
Alkaline phosphatase (APISO): 80 U/L (ref 37–153)
BUN: 13 mg/dL (ref 7–25)
CO2: 29 mmol/L (ref 20–32)
Calcium: 8.8 mg/dL (ref 8.6–10.4)
Chloride: 103 mmol/L (ref 98–110)
Creat: 0.87 mg/dL (ref 0.60–1.00)
Globulin: 4.7 g/dL (calc) — ABNORMAL HIGH (ref 1.9–3.7)
Glucose, Bld: 178 mg/dL — ABNORMAL HIGH (ref 65–99)
Potassium: 4.1 mmol/L (ref 3.5–5.3)
Sodium: 142 mmol/L (ref 135–146)
Total Bilirubin: 0.8 mg/dL (ref 0.2–1.2)
Total Protein: 8.1 g/dL (ref 6.1–8.1)
eGFR: 69 mL/min/{1.73_m2} (ref >=60)

## 2022-03-28 LAB — CBC
HCT: 33.1 % — ABNORMAL LOW (ref 35.0–45.0)
Hemoglobin: 10.5 g/dL — ABNORMAL LOW (ref 11.7–15.5)
MCH: 30.3 pg (ref 27.0–33.0)
MCHC: 31.7 g/dL — ABNORMAL LOW (ref 32.0–36.0)
MCV: 95.4 fL (ref 80.0–100.0)
MPV: 12.6 fL — ABNORMAL HIGH (ref 7.5–12.5)
Platelets: 187 10*3/uL (ref 140–400)
RBC: 3.47 Million/uL — ABNORMAL LOW (ref 3.80–5.10)
RDW: 12.9 % (ref 11.0–15.0)
WBC: 9.3 10*3/uL (ref 3.8–10.8)

## 2022-03-28 LAB — C-REACTIVE PROTEIN: CRP: 25 mg/L — ABNORMAL HIGH

## 2022-04-01 ENCOUNTER — Telehealth: Payer: Self-pay

## 2022-04-01 NOTE — Telephone Encounter (Signed)
-----   Message from Jabier Mutton, MD sent at 04/01/2022  1:39 PM EDT ----- ?Please let patient know to continue her antibiotics. The repeat ct scan still shows fluid collection where the drain was. Will see when radiology removes the drain and see her then in follow up (3-4 weeks) before deciding.  ? ?thanks ?

## 2022-04-01 NOTE — Telephone Encounter (Signed)
Patient aware of results. Scheduled for follow up with Dr.Vu on 5/23. ? ? ?Derrin Currey P Sokha Craker, CMA ? ?

## 2022-04-02 ENCOUNTER — Telehealth: Payer: Self-pay

## 2022-04-02 MED ORDER — AMOXICILLIN-POT CLAVULANATE 875-125 MG PO TABS: 1.0000 | ORAL_TABLET | Freq: Two times a day (BID) | ORAL | 0 refills | Status: AC

## 2022-04-02 NOTE — Telephone Encounter (Signed)
Daughter states patient will run out of antibiotics on the 16th and appointment isn't scheduled until the 23rd.  Would you like to add a refill?  Thanks ?

## 2022-04-04 NOTE — Telephone Encounter (Signed)
Daughter notified RX refill has been sent and will pickup today 5/11 ?

## 2022-04-10 ENCOUNTER — Telehealth: Payer: Medicare HMO | Admitting: Internal Medicine

## 2022-04-11 ENCOUNTER — Ambulatory Visit
Admission: RE | Admit: 2022-04-11 | Discharge: 2022-04-11 | Disposition: A | Discharge status: Home / Self Care | Payer: Medicare HMO | Source: Ambulatory Visit | Attending: Internal Medicine | Admitting: Internal Medicine

## 2022-04-11 ENCOUNTER — Encounter: Payer: Self-pay | Admitting: *Deleted

## 2022-04-11 DIAGNOSIS — Z9889 Other specified postprocedural states: Secondary | ICD-10-CM | POA: Diagnosis not present

## 2022-04-11 DIAGNOSIS — N281 Cyst of kidney, acquired: Secondary | ICD-10-CM | POA: Diagnosis not present

## 2022-04-11 DIAGNOSIS — K810 Acute cholecystitis: Secondary | ICD-10-CM

## 2022-04-11 DIAGNOSIS — K429 Umbilical hernia without obstruction or gangrene: Secondary | ICD-10-CM | POA: Diagnosis not present

## 2022-04-11 DIAGNOSIS — I700 Atherosclerosis of aorta: Secondary | ICD-10-CM | POA: Diagnosis not present

## 2022-04-11 DIAGNOSIS — Z4682 Encounter for fitting and adjustment of non-vascular catheter: Secondary | ICD-10-CM | POA: Diagnosis not present

## 2022-04-11 HISTORY — PX: IR RADIOLOGIST EVAL & MGMT: IMG5224

## 2022-04-11 MED ORDER — IOPAMIDOL (ISOVUE-370) INJECTION 76%
80.0000 mL | Freq: Once | INTRAVENOUS | Status: AC | PRN
  Administered 2022-04-11: 80 mL via INTRAVENOUS

## 2022-04-11 NOTE — Progress Notes (Signed)
Referring Physician(s): Vu,Trung T  Chief Complaint: The patient is seen in follow up today s/p lap chole 02/19/22- Dr Noberto Retort Post op abscess GB fossa drain placed 4/17 in IR   History of present illness:  5/3 follow up CT revealing persistent collection Drain not removed Follows with ID Dr Darnelle Maffucci At time of follow up in IR--- pt was instructed to flush more often and drain chabged to suction JP per Dr Maryelizabeth Kaufmann  Returns today for re evaluation and CT  OP from drain is minimal to none OP is clear/cloudy yellow Flushes 3x/daily Denies fever/chills; denies pain Denies N/V  Follows with Dr Gale Journey To hear from him 5/23- virtual visit To continue Augmentin fir 2 more weeks per Dtr   Past Medical History:  Diagnosis Date   Diabetes mellitus without complication ([place])    GERD (gastroesophageal reflux disease)    Hypertension     Past Surgical History:  Procedure Laterality Date   ANKLE FRACTURE SURGERY     ERCP N/A 03/09/2022   Procedure: ENDOSCOPIC RETROGRADE CHOLANGIOPANCREATOGRAPHY (ERCP);  Surgeon: Clarene Essex, MD;  Location: [facility];  Service: Gastroenterology;  Laterality: N/A;   IR RADIOLOGIST EVAL & MGMT  03/27/2022   REMOVAL OF STONES  03/09/2022   Procedure: REMOVAL OF STONES;  Surgeon: Clarene Essex, MD;  Location: [facility];  Service: Gastroenterology;;   Joan Mayans  03/09/2022   Procedure: Joan Mayans;  Surgeon: Clarene Essex, MD;  Location: [facility];  Service: Gastroenterology;;    Allergies: Ibuprofen  Medications: Prior to Admission medications   Medication Sig Start Date End Date Taking? Authorizing Provider  albuterol (VENTOLIN HFA) 108 (90 Base) MCG/ACT inhaler Inhale 2 puffs into the lungs every 6 (six) hours as needed for wheezing or shortness of breath.    [provider]  amoxicillin-clavulanate (AUGMENTIN) 875-125 MG tablet Take 1 tablet by mouth 2 (two) times daily for 14 days. 04/02/22 04/16/22  Vu, Johnny Bridge T, MD  aspirin EC 81 MG  tablet Take 81 mg by mouth in the morning. Swallow whole.    [provider]  atorvastatin (LIPITOR) 40 MG tablet Take 40 mg by mouth at bedtime.    [provider]  Cholecalciferol (VITAMIN D-3) 25 MCG (1000 UT) CAPS Take 1,000 Units by mouth in the morning.    [provider]  citalopram (CELEXA) 10 MG tablet Take 10 mg by mouth at bedtime.    [provider]  diazepam (VALIUM) 5 MG tablet Take 5 mg by mouth daily as needed for anxiety.    [provider]  Garlic 0109 MG CAPS Take 1,000 mg by mouth in the morning.    [provider]  lisinopril (ZESTRIL) 5 MG tablet Take 5 mg by mouth in the morning.    [provider]  metoprolol tartrate (LOPRESSOR) 100 MG tablet Take 50 mg by mouth in the morning and at bedtime.    [provider]  Multiple Vitamins-Minerals (ONE-A-DAY WOMENS 50+) TABS Take 1 tablet by mouth daily with breakfast.    [provider]  niacin 500 MG tablet Take 500 mg by mouth in the morning.    [provider]  Omega-3 Fatty Acids (FISH OIL) 1200 MG CAPS Take 1,200 mg by mouth in the morning and at bedtime.    [provider]  omeprazole (PRILOSEC) 10 MG capsule Take 10 mg by mouth daily before breakfast.    [provider]  pioglitazone (ACTOS) 30 MG tablet Take 30 mg by mouth in  the morning.    [provider]  Semaglutide,0.25 or 0.'5MG'$ /DOS, (OZEMPIC, 0.25 OR 0.5 MG/DOSE,) 2 MG/1.5ML SOPN Inject 0.25 mg into the skin every Saturday. Patient not taking: Reported on 03/27/2022    [provider]     No family history on file.  Social History   Socioeconomic History   Marital status: Married    Spouse name: Not on file   Number of children: Not on file   Years of education: Not on file   Highest education level: Not on file  Occupational History   Not on file  Tobacco Use   Smoking status: Never   Smokeless tobacco: Never  Vaping Use   Vaping  Use: Never used  Substance and Sexual Activity   Alcohol use: Never   Drug use: Never   Sexual activity: Not Currently  Other Topics Concern   Not on file  Social History Narrative   ** Merged History Encounter **       Social Determinants of Health   Financial Resource Strain: Not on file  Food Insecurity: Not on file  Transportation Needs: Not on file  Physical Activity: Not on file  Stress: Not on file  Social Connections: Not on file     Vital Signs: There were no vitals taken for this visit.  Physical Exam Skin:    General: Skin is warm.     Comments: Site of drain is clean and dry NT no bleeding No sign of infection Drain removed in its entirety Dressing placed  CT today read as - abscess resolved per Dr Serafina Royals    Imaging: No results found.  Labs:  CBC: Recent Labs    03/10/22 0430 03/11/22 0423 03/12/22 0428 03/27/22 1023  WBC 5.1 5.9 7.0 9.3  HGB 8.8* 9.1* 9.8* 10.5*  HCT 28.7* 30.3* 32.0* 33.1*  PLT 223 238 241 187    COAGS: No results for input(s): INR, APTT in the last 8760 hours.  BMP: Recent Labs    03/10/22 0430 03/11/22 0423 03/12/22 0428 03/13/22 0454 03/27/22 1023  NA 138 142 140 142 142  K 4.3 3.5 3.9 4.0 4.1  CL 99 102 101 104 103  CO2 '30 31 31 31 29  '$ GLUCOSE 130* 131* 107* 110* 178*  BUN '13 13 9 '$ 7* 13  CALCIUM 8.0* 7.9* 8.0* 7.9* 8.8  CREATININE 1.01* 1.25* 0.99 1.07* 0.87  GFRNONAA 57* 44* 59* 53*  --     LIVER FUNCTION TESTS: Recent Labs    03/10/22 0430 03/11/22 0423 03/12/22 0428 03/13/22 0454 03/27/22 1023  BILITOT 1.0 0.6 0.7 1.7* 0.8  AST 76* 53* 46* 46* 22  ALT 51* 42 35 29 11  ALKPHOS 191* 152* 141* 119  --   PROT 7.7 7.5 8.1 7.6 8.1  ALBUMIN 2.4* 2.4* 2.5* 2.3*  --     Assessment:  GB fossa abscess drain placed 03/11/22 Abscess resolved per CT today per Dr Lara Mulch; no OP No complaints Drain removed today Continue antibiotic course To follow up with Dr Gale Journey virtual appt 5/23 Released  from IR standpoint  Signed: Lavonia Drafts, PA-C 04/11/2022, 12:58 PM   Please refer to Dr. Serafina Royals attestation of this note for management and plan.

## 2022-04-16 ENCOUNTER — Telehealth (INDEPENDENT_AMBULATORY_CARE_PROVIDER_SITE_OTHER): Payer: Medicare HMO | Admitting: Internal Medicine

## 2022-04-16 ENCOUNTER — Other Ambulatory Visit: Payer: Self-pay

## 2022-04-16 DIAGNOSIS — K651 Peritoneal abscess: Secondary | ICD-10-CM

## 2022-04-16 NOTE — Patient Instructions (Signed)
Phone video visit

## 2022-04-16 NOTE — Progress Notes (Signed)
[facility] for Infectious Disease  Reason for Consult:cholelithiasis/gallbladder fossa abscess Referring Provider: Antonieta Pert    Patient Active Problem List   Diagnosis Date Noted   Abscess of abdominal cavity ([place]) 03/12/2022   Obesity, Class II, BMI 35-39.9 03/12/2022   Choledocholithiasis 03/08/2022   HTN (hypertension) 03/08/2022   Elevated LFTs 03/08/2022   DM2 (diabetes mellitus, type 2) ([place]) 03/08/2022   GERD (gastroesophageal reflux disease) 03/08/2022   Macrocytic anemia 03/08/2022      HPI: Tricia Butler is a 76 y.o. female dm2, hx of gall bladder stone, here for new patient evaluation of gall bladder abscess  She was admitted 4/14-19 with epigastric pain and sepsis found to have gallbladder abscess and cbd stone. She had a percutaneous drain placed by IR. Put on abx and will follow up with surgery  She had ercp and several stones were retrieved  Reviewed chart/micro  She is feeling completely normal today. Eating well No f/c/diarrhea  The drain output is much decreased. With the saline flush, she could get less than 5 cc a day for since hospital discharge. Greenish material/liquid   She'll see IR today and general surgery next week  She is taking augmentin and probiotics. Her drain culture previously grew e faecium and e gallinarum that is sensitive to ampicillin  She is here with her daughter. She walks with fww (chronic)  5/23 id clinic f/u  I verified that I was speaking with the correct person using two identifiers. Due to the COVID-19 Pandemic/patient's desire, this service was provided via telemedicine using audio/visual media.   The patient was located at home. The provider was located in the office. The patient did consent to this visit and is aware of charges through their insurance as well as the limitations of evaluation and management by telemedicine. Other persons participating in this telemedicine service were none. Time  spent on visit was greater than 25 minutes on media and in coordination of care   Review of Systems: ROS All other ros negative      Past Medical History:  Diagnosis Date   Diabetes mellitus without complication ([facility])    GERD (gastroesophageal reflux disease)    Hypertension     Social History   Tobacco Use   Smoking status: Never   Smokeless tobacco: Never  Vaping Use   Vaping Use: Never used  Substance Use Topics   Alcohol use: Never   Drug use: Never    Family hx: Daughter has gall bladder stone  Allergies  Allergen Reactions   Ibuprofen Other (See Comments)    Pt reports nosebleed    OBJECTIVE: There were no vitals filed for this visit.  There is no height or weight on file to calculate BMI.   Physical Exam Video visit Patient with her daughter on video  No distress; conversant Pulm -- normal respiratory effort Neuro -- appropriate; normal speech; moving all extremities without deficit   Lab: Lab Results  Component Value Date   WBC 9.3 03/27/2022   HGB 10.5 (L) 03/27/2022   HCT 33.1 (L) 03/27/2022   MCV 95.4 03/27/2022   PLT 187 03/27/2022    Microbiology:  Serology:  Imaging: 5/18 abd ct 1. Resolution of previously visualized gallbladder fossa fluid collection with unchanged position of indwelling drain. 2. Morphologic changes suggestive of hepatic cirrhosis. No signs of portal hypertension. 3.  Aortic Atherosclerosis    Assessment/plan: Problem List Items Addressed This Visit   None Visit Diagnoses  Intra-abdominal abscess ([place])    -  Primary      Abx: Augmentin   Cholecystitis Gall bladder/cbd stones Gall bladder fossa abscess   S/p ercp 4/15  S/p perc drain placement 4/17  4/15 bcx negative 4/17 drain fluid cx e faecium and gallinarum S amp. Improved on augmentin   -f/u IR and gen surg -continue antibiotics for now -once drain removed plan another 1-2 weeks abx -video visit in 2 weeks -labs to assess  lft, abx toxicity   04/16/22 id assessment Drain removed Doing well No antibiotics side effect  -would finish the 7 more days of antibiotics (augmentin) -discuss relapse if occurs usually happen within the first several weeks after she finishes antibiotics -if fever, chill, n/v, malaise, decreased appetite, call me otherwise no need to schedule any follow up at this time     Follow-up: Return if symptoms worsen or fail to improve.  I have spent a total of 25 minutes of face-to-face and non-face-to-face time, excluding clinical staff time, preparing to see patient, ordering tests and/or medications, and provide counseling the patient    Jabier Mutton, [place] [facility] 04/16/2022, 9:44 AM

## 2022-05-15 DIAGNOSIS — E038 Other specified hypothyroidism: Secondary | ICD-10-CM | POA: Diagnosis not present

## 2022-05-15 DIAGNOSIS — E119 Type 2 diabetes mellitus without complications: Secondary | ICD-10-CM | POA: Diagnosis not present

## 2022-05-15 DIAGNOSIS — R69 Illness, unspecified: Secondary | ICD-10-CM | POA: Diagnosis not present

## 2022-05-15 DIAGNOSIS — D518 Other vitamin B12 deficiency anemias: Secondary | ICD-10-CM | POA: Diagnosis not present

## 2022-05-15 DIAGNOSIS — E559 Vitamin D deficiency, unspecified: Secondary | ICD-10-CM | POA: Diagnosis not present

## 2022-05-15 DIAGNOSIS — E1140 Type 2 diabetes mellitus with diabetic neuropathy, unspecified: Secondary | ICD-10-CM | POA: Diagnosis not present

## 2022-05-15 DIAGNOSIS — I10 Essential (primary) hypertension: Secondary | ICD-10-CM | POA: Diagnosis not present

## 2022-05-15 DIAGNOSIS — E782 Mixed hyperlipidemia: Secondary | ICD-10-CM | POA: Diagnosis not present

## 2022-05-15 DIAGNOSIS — E1165 Type 2 diabetes mellitus with hyperglycemia: Secondary | ICD-10-CM | POA: Diagnosis not present

## 2022-06-21 DIAGNOSIS — E782 Mixed hyperlipidemia: Secondary | ICD-10-CM | POA: Diagnosis not present

## 2022-06-21 DIAGNOSIS — E038 Other specified hypothyroidism: Secondary | ICD-10-CM | POA: Diagnosis not present

## 2022-06-21 DIAGNOSIS — D518 Other vitamin B12 deficiency anemias: Secondary | ICD-10-CM | POA: Diagnosis not present

## 2022-06-21 DIAGNOSIS — I10 Essential (primary) hypertension: Secondary | ICD-10-CM | POA: Diagnosis not present

## 2022-06-21 DIAGNOSIS — E559 Vitamin D deficiency, unspecified: Secondary | ICD-10-CM | POA: Diagnosis not present

## 2022-06-21 DIAGNOSIS — E119 Type 2 diabetes mellitus without complications: Secondary | ICD-10-CM | POA: Diagnosis not present

## 2022-06-21 DIAGNOSIS — R69 Illness, unspecified: Secondary | ICD-10-CM | POA: Diagnosis not present

## 2022-06-21 DIAGNOSIS — K219 Gastro-esophageal reflux disease without esophagitis: Secondary | ICD-10-CM | POA: Diagnosis not present

## 2022-06-24 DIAGNOSIS — R69 Illness, unspecified: Secondary | ICD-10-CM | POA: Diagnosis not present

## 2022-06-24 DIAGNOSIS — E559 Vitamin D deficiency, unspecified: Secondary | ICD-10-CM | POA: Diagnosis not present

## 2022-06-24 DIAGNOSIS — E1140 Type 2 diabetes mellitus with diabetic neuropathy, unspecified: Secondary | ICD-10-CM | POA: Diagnosis not present

## 2022-06-24 DIAGNOSIS — E782 Mixed hyperlipidemia: Secondary | ICD-10-CM | POA: Diagnosis not present

## 2022-06-24 DIAGNOSIS — I10 Essential (primary) hypertension: Secondary | ICD-10-CM | POA: Diagnosis not present

## 2022-06-24 DIAGNOSIS — E1165 Type 2 diabetes mellitus with hyperglycemia: Secondary | ICD-10-CM | POA: Diagnosis not present

## 2022-06-24 DIAGNOSIS — E038 Other specified hypothyroidism: Secondary | ICD-10-CM | POA: Diagnosis not present

## 2022-06-24 DIAGNOSIS — D518 Other vitamin B12 deficiency anemias: Secondary | ICD-10-CM | POA: Diagnosis not present

## 2022-06-24 DIAGNOSIS — E119 Type 2 diabetes mellitus without complications: Secondary | ICD-10-CM | POA: Diagnosis not present

## 2022-07-16 ENCOUNTER — Ambulatory Visit: Payer: Medicare HMO | Admitting: Podiatrist

## 2022-07-16 ENCOUNTER — Encounter: Payer: Self-pay | Admitting: Podiatrist

## 2022-07-16 DIAGNOSIS — B070 Plantar wart: Secondary | ICD-10-CM

## 2022-07-16 DIAGNOSIS — L858 Other specified epidermal thickening: Secondary | ICD-10-CM | POA: Diagnosis not present

## 2022-07-16 NOTE — Progress Notes (Signed)
Chief Complaint  Patient presents with   Plantar Warts    Left plantar wart x 8 months. Occasional soreness no drainage or bleeding.      HPI: Patient is 76 y.o. female who presents today for a large wart like lesion on the plantar aspect of left foot.  She has had it for about 8 months.  She states it is occasionally sore relates no drainage no bleeding from the area.  Relates pain with ambulation and when she steps on it it is uncomfortable.  Patient Active Problem List   Diagnosis Date Noted   Abscess of abdominal cavity ([place]) 03/12/2022   Obesity, Class II, BMI 35-39.9 03/12/2022   Choledocholithiasis 03/08/2022   HTN (hypertension) 03/08/2022   Elevated LFTs 03/08/2022   DM2 (diabetes mellitus, type 2) ([place]) 03/08/2022   GERD (gastroesophageal reflux disease) 03/08/2022   Macrocytic anemia 03/08/2022    Current Outpatient Medications on File Prior to Visit  Medication Sig Dispense Refill   albuterol (VENTOLIN HFA) 108 (90 Base) MCG/ACT inhaler Inhale 2 puffs into the lungs every 6 (six) hours as needed for wheezing or shortness of breath.     aspirin EC 81 MG tablet Take 81 mg by mouth in the morning. Swallow whole.     atorvastatin (LIPITOR) 40 MG tablet Take 40 mg by mouth at bedtime.     Cholecalciferol (VITAMIN D-3) 25 MCG (1000 UT) CAPS Take 1,000 Units by mouth in the morning.     citalopram (CELEXA) 10 MG tablet Take 10 mg by mouth at bedtime.     diazepam (VALIUM) 5 MG tablet Take 5 mg by mouth daily as needed for anxiety.     Garlic 4765 MG CAPS Take 1,000 mg by mouth in the morning.     lisinopril (ZESTRIL) 5 MG tablet Take 5 mg by mouth in the morning.     metoprolol tartrate (LOPRESSOR) 100 MG tablet Take 50 mg by mouth in the morning and at bedtime.     Multiple Vitamins-Minerals (ONE-A-DAY WOMENS 50+) TABS Take 1 tablet by mouth daily with breakfast.     niacin 500 MG tablet Take 500 mg by mouth in the morning.     Omega-3 Fatty Acids (FISH OIL) 1200 MG CAPS Take  1,200 mg by mouth in the morning and at bedtime.     omeprazole (PRILOSEC) 10 MG capsule Take 10 mg by mouth daily before breakfast.     pioglitazone (ACTOS) 30 MG tablet Take 30 mg by mouth in the morning.     Semaglutide,0.25 or 0.'5MG'$ /DOS, (OZEMPIC, 0.25 OR 0.5 MG/DOSE,) 2 MG/1.5ML SOPN Inject 0.25 mg into the skin every Saturday. (Patient not taking: Reported on 04/16/2022)     No current facility-administered medications on file prior to visit.    Allergies  Allergen Reactions   Ibuprofen Other (See Comments)    Pt reports nosebleed    Review of Systems No fevers, chills, nausea, muscle aches, no difficulty breathing, no calf pain, no chest pain or shortness of breath.   Physical Exam  GENERAL APPEARANCE: Alert, conversant. Appropriately groomed. No acute distress.   VASCULAR: Pedal pulses palpable 2/4 DP and 2/4 PT bilateral.  Capillary refill time is immediate to all digits,  Proximal to distal cooling it warm to warm.  Digital perfusion adequate.   NEUROLOGIC: sensation is intact to 5.07 monofilament at 5/5 sites bilateral.  Light touch is intact bilateral, vibratory sensation intact bilateral  MUSCULOSKELETAL: acceptable muscle strength, tone and stability bilateral.  No gross  boney pedal deformities noted.  No pain, crepitus or limitation noted with foot and ankle range of motion bilateral.   DERMATOLOGIC: skin is warm, supple, and dry.  Color, texture, and turgor of skin within normal limits.  Well-circumscribed verrucous appearing lesion is present on the plantar aspect of the left foot.  It measures approximately 1 cm in diameter.  It has a verrucous appearance with multiple capillary budding throughout and loss of skin tension lines.  Assessment     ICD-10-CM   1. Plantar wart, left foot  B07.0        Plan  Discussed treatment options and alternatives.  I pared the lesion down and applied Canthacur followed by dressing.  The wart does have a large appearance to it  therefore I took a sample of the tissue and sent for culture.  We will call her with the result of the culture.  Otherwise we will see her back in about 3 weeks for follow-up.

## 2022-07-22 DIAGNOSIS — E119 Type 2 diabetes mellitus without complications: Secondary | ICD-10-CM | POA: Diagnosis not present

## 2022-07-22 DIAGNOSIS — E559 Vitamin D deficiency, unspecified: Secondary | ICD-10-CM | POA: Diagnosis not present

## 2022-07-22 DIAGNOSIS — I10 Essential (primary) hypertension: Secondary | ICD-10-CM | POA: Diagnosis not present

## 2022-07-22 DIAGNOSIS — E1165 Type 2 diabetes mellitus with hyperglycemia: Secondary | ICD-10-CM | POA: Diagnosis not present

## 2022-07-22 DIAGNOSIS — D518 Other vitamin B12 deficiency anemias: Secondary | ICD-10-CM | POA: Diagnosis not present

## 2022-07-22 DIAGNOSIS — R69 Illness, unspecified: Secondary | ICD-10-CM | POA: Diagnosis not present

## 2022-07-22 DIAGNOSIS — E038 Other specified hypothyroidism: Secondary | ICD-10-CM | POA: Diagnosis not present

## 2022-07-22 DIAGNOSIS — E1140 Type 2 diabetes mellitus with diabetic neuropathy, unspecified: Secondary | ICD-10-CM | POA: Diagnosis not present

## 2022-07-22 DIAGNOSIS — E782 Mixed hyperlipidemia: Secondary | ICD-10-CM | POA: Diagnosis not present

## 2022-07-30 ENCOUNTER — Telehealth: Payer: Self-pay | Admitting: *Deleted

## 2022-07-30 NOTE — Telephone Encounter (Signed)
Patient is calling for status of lab biopsy results from 07/16/22, not seeing anything in epic that it was sent out, please advise.

## 2022-08-01 NOTE — Telephone Encounter (Signed)
[facility], did receive the culture, will be faxing the results, will place copy in folder, one to be scanned into epic as well. Results are now in your folder to review, did you still want to schedule the patient f/u with Dr Loel Lofty?

## 2022-08-01 NOTE — Telephone Encounter (Signed)
-----   Message from Bronson Ing, DPM sent at 07/25/2022  8:48 PM EDT ----- Regarding: follow up appointment from visit where culture was supposed to be sent- but looks like no culture in charg [facility]-  I saw this patient last week.  I took a culture to send to [facility] but there is no record of it having been sent in the chart.  Is there a way you can check with [facility] to see if they received a specimen?   I doubt it was sent off since there is no record of it in the chart-  So.. if there was no culture sent,  could you ask one of the schedulers to call her and make a follow up appointment for next week with Dr. Loel Lofty?  She need to be looked at again   Thanks!!!  Dr. Johnette Abraham

## 2022-08-02 ENCOUNTER — Other Ambulatory Visit: Payer: Self-pay | Admitting: Podiatrist

## 2022-08-02 MED ORDER — FLUOROURACIL 5 % EX CREA: TOPICAL_CREAM | Freq: Two times a day (BID) | CUTANEOUS | 0 refills | Status: AC

## 2022-08-02 NOTE — Progress Notes (Signed)
Called with result of Pathology report which was Deep Palmoplantar Wart with no histopathologic evidence of malignancy or melanoma.  Called in Efudex cream to start using.  Will have her follow up in 1-2 weeks in the office for recheck

## 2022-08-06 DIAGNOSIS — E038 Other specified hypothyroidism: Secondary | ICD-10-CM | POA: Diagnosis not present

## 2022-08-06 DIAGNOSIS — D518 Other vitamin B12 deficiency anemias: Secondary | ICD-10-CM | POA: Diagnosis not present

## 2022-08-06 DIAGNOSIS — E1165 Type 2 diabetes mellitus with hyperglycemia: Secondary | ICD-10-CM | POA: Diagnosis not present

## 2022-08-06 DIAGNOSIS — E1140 Type 2 diabetes mellitus with diabetic neuropathy, unspecified: Secondary | ICD-10-CM | POA: Diagnosis not present

## 2022-08-06 DIAGNOSIS — E782 Mixed hyperlipidemia: Secondary | ICD-10-CM | POA: Diagnosis not present

## 2022-08-06 DIAGNOSIS — E559 Vitamin D deficiency, unspecified: Secondary | ICD-10-CM | POA: Diagnosis not present

## 2022-08-06 DIAGNOSIS — E119 Type 2 diabetes mellitus without complications: Secondary | ICD-10-CM | POA: Diagnosis not present

## 2022-08-06 DIAGNOSIS — I10 Essential (primary) hypertension: Secondary | ICD-10-CM | POA: Diagnosis not present

## 2022-08-06 DIAGNOSIS — R69 Illness, unspecified: Secondary | ICD-10-CM | POA: Diagnosis not present

## 2022-08-14 NOTE — Telephone Encounter (Signed)
-----   Message from Bronson Ing, DPM sent at 07/25/2022  8:48 PM EDT ----- Regarding: follow up appointment from visit where culture was supposed to be sent- but looks like no culture in charg [facility]-  I saw this patient last week.  I took a culture to send to [facility] but there is no record of it having been sent in the chart.  Is there a way you can check with [facility] to see if they received a specimen?   I doubt it was sent off since there is no record of it in the chart-  So.. if there was no culture sent,  could you ask one of the schedulers to call her and make a follow up appointment for next week with Dr. Loel Lofty?  She need to be looked at again   Thanks!!!  Dr. Johnette Abraham

## 2022-09-18 DIAGNOSIS — E119 Type 2 diabetes mellitus without complications: Secondary | ICD-10-CM | POA: Diagnosis not present

## 2022-09-18 DIAGNOSIS — E7849 Other hyperlipidemia: Secondary | ICD-10-CM | POA: Diagnosis not present

## 2022-09-18 DIAGNOSIS — E038 Other specified hypothyroidism: Secondary | ICD-10-CM | POA: Diagnosis not present

## 2022-09-18 DIAGNOSIS — Z79899 Other long term (current) drug therapy: Secondary | ICD-10-CM | POA: Diagnosis not present

## 2022-09-18 DIAGNOSIS — I10 Essential (primary) hypertension: Secondary | ICD-10-CM | POA: Diagnosis not present

## 2022-09-18 DIAGNOSIS — D518 Other vitamin B12 deficiency anemias: Secondary | ICD-10-CM | POA: Diagnosis not present

## 2022-09-18 DIAGNOSIS — R69 Illness, unspecified: Secondary | ICD-10-CM | POA: Diagnosis not present

## 2022-09-18 DIAGNOSIS — K219 Gastro-esophageal reflux disease without esophagitis: Secondary | ICD-10-CM | POA: Diagnosis not present

## 2022-09-18 DIAGNOSIS — E782 Mixed hyperlipidemia: Secondary | ICD-10-CM | POA: Diagnosis not present

## 2022-09-18 DIAGNOSIS — E559 Vitamin D deficiency, unspecified: Secondary | ICD-10-CM | POA: Diagnosis not present

## 2022-09-24 DIAGNOSIS — E119 Type 2 diabetes mellitus without complications: Secondary | ICD-10-CM | POA: Diagnosis not present

## 2022-09-24 DIAGNOSIS — I10 Essential (primary) hypertension: Secondary | ICD-10-CM | POA: Diagnosis not present

## 2022-09-24 DIAGNOSIS — E559 Vitamin D deficiency, unspecified: Secondary | ICD-10-CM | POA: Diagnosis not present

## 2022-09-24 DIAGNOSIS — E782 Mixed hyperlipidemia: Secondary | ICD-10-CM | POA: Diagnosis not present

## 2022-09-24 DIAGNOSIS — E1165 Type 2 diabetes mellitus with hyperglycemia: Secondary | ICD-10-CM | POA: Diagnosis not present

## 2022-09-24 DIAGNOSIS — E1140 Type 2 diabetes mellitus with diabetic neuropathy, unspecified: Secondary | ICD-10-CM | POA: Diagnosis not present

## 2022-09-24 DIAGNOSIS — D518 Other vitamin B12 deficiency anemias: Secondary | ICD-10-CM | POA: Diagnosis not present

## 2022-09-24 DIAGNOSIS — R69 Illness, unspecified: Secondary | ICD-10-CM | POA: Diagnosis not present

## 2022-09-24 DIAGNOSIS — E038 Other specified hypothyroidism: Secondary | ICD-10-CM | POA: Diagnosis not present

## 2022-10-21 DIAGNOSIS — D518 Other vitamin B12 deficiency anemias: Secondary | ICD-10-CM | POA: Diagnosis not present

## 2022-10-21 DIAGNOSIS — E038 Other specified hypothyroidism: Secondary | ICD-10-CM | POA: Diagnosis not present

## 2022-10-21 DIAGNOSIS — E119 Type 2 diabetes mellitus without complications: Secondary | ICD-10-CM | POA: Diagnosis not present

## 2022-10-21 DIAGNOSIS — E1140 Type 2 diabetes mellitus with diabetic neuropathy, unspecified: Secondary | ICD-10-CM | POA: Diagnosis not present

## 2022-10-21 DIAGNOSIS — R69 Illness, unspecified: Secondary | ICD-10-CM | POA: Diagnosis not present

## 2022-10-21 DIAGNOSIS — I10 Essential (primary) hypertension: Secondary | ICD-10-CM | POA: Diagnosis not present

## 2022-10-21 DIAGNOSIS — E559 Vitamin D deficiency, unspecified: Secondary | ICD-10-CM | POA: Diagnosis not present

## 2022-10-21 DIAGNOSIS — E1165 Type 2 diabetes mellitus with hyperglycemia: Secondary | ICD-10-CM | POA: Diagnosis not present

## 2022-10-21 DIAGNOSIS — E782 Mixed hyperlipidemia: Secondary | ICD-10-CM | POA: Diagnosis not present

## 2022-11-01 DIAGNOSIS — E1140 Type 2 diabetes mellitus with diabetic neuropathy, unspecified: Secondary | ICD-10-CM | POA: Diagnosis not present

## 2022-11-01 DIAGNOSIS — E559 Vitamin D deficiency, unspecified: Secondary | ICD-10-CM | POA: Diagnosis not present

## 2022-11-01 DIAGNOSIS — I10 Essential (primary) hypertension: Secondary | ICD-10-CM | POA: Diagnosis not present

## 2022-11-01 DIAGNOSIS — E119 Type 2 diabetes mellitus without complications: Secondary | ICD-10-CM | POA: Diagnosis not present

## 2022-11-01 DIAGNOSIS — R69 Illness, unspecified: Secondary | ICD-10-CM | POA: Diagnosis not present

## 2022-11-01 DIAGNOSIS — E782 Mixed hyperlipidemia: Secondary | ICD-10-CM | POA: Diagnosis not present

## 2022-11-01 DIAGNOSIS — E038 Other specified hypothyroidism: Secondary | ICD-10-CM | POA: Diagnosis not present

## 2022-11-01 DIAGNOSIS — E1165 Type 2 diabetes mellitus with hyperglycemia: Secondary | ICD-10-CM | POA: Diagnosis not present

## 2022-11-01 DIAGNOSIS — D518 Other vitamin B12 deficiency anemias: Secondary | ICD-10-CM | POA: Diagnosis not present

## 2023-10-15 IMAGING — CT CT IMAGE GUIDED DRAINAGE BY PERCUTANEOUS CATHETER
1 of 2 series · 14 of 32 positions shown, 19 images · non-contrast
Comparison: none

CLINICAL DATA: Abscess in gallbladder fossa

[Series 2: i-spiral 5.0 bf37 · axial · 0.97mm/px · z∈[-216,-62]mm · 14 of 50 slices shown, 19 images]
[im 3/50  soft-tissue]
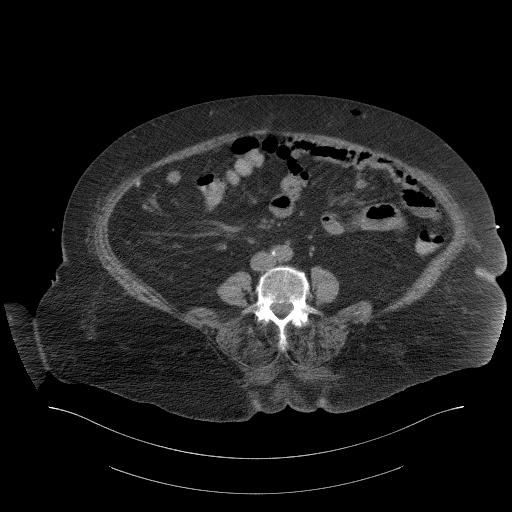
[im 3/50  bone]
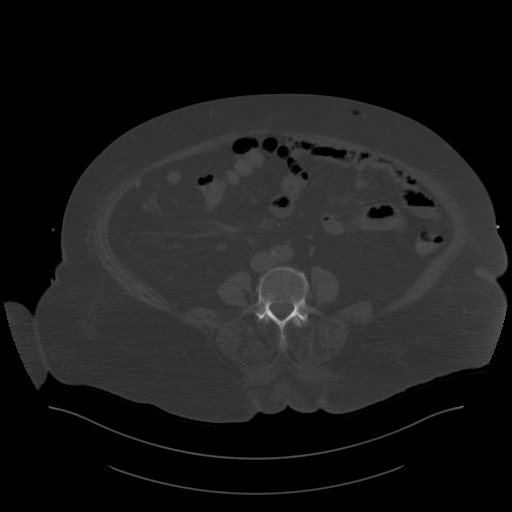
[im 8/50  soft-tissue]
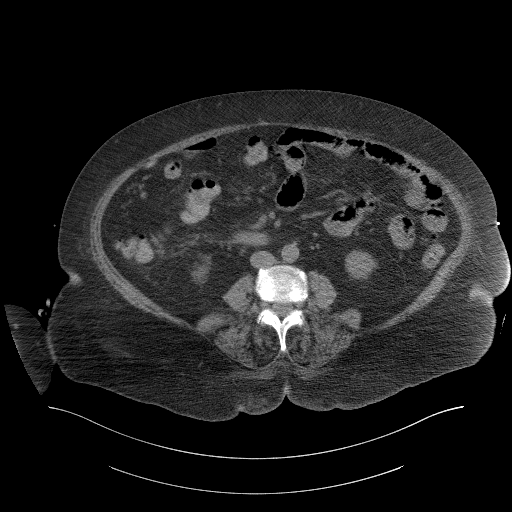
[im 11/50  soft-tissue]
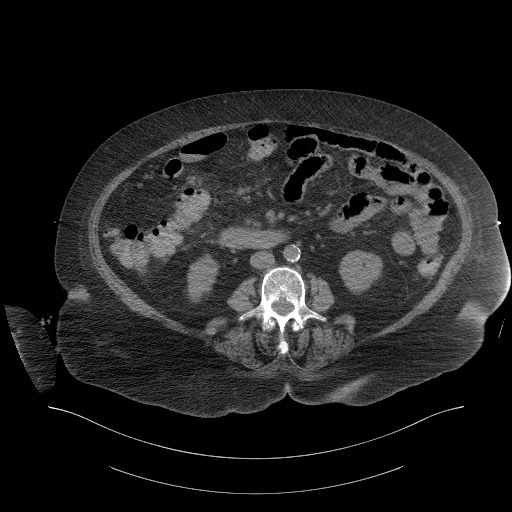
[im 13/50  soft-tissue]
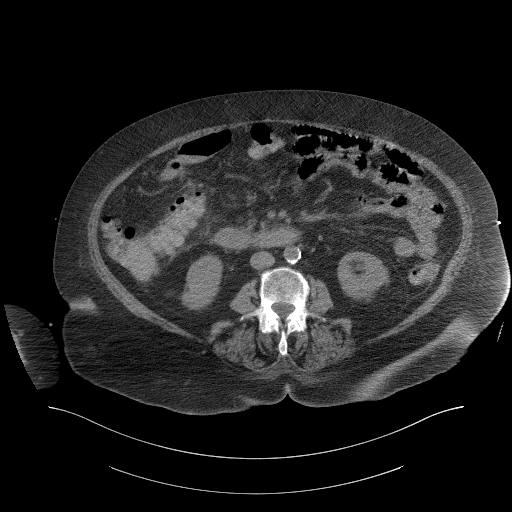
[im 19/50  soft-tissue]
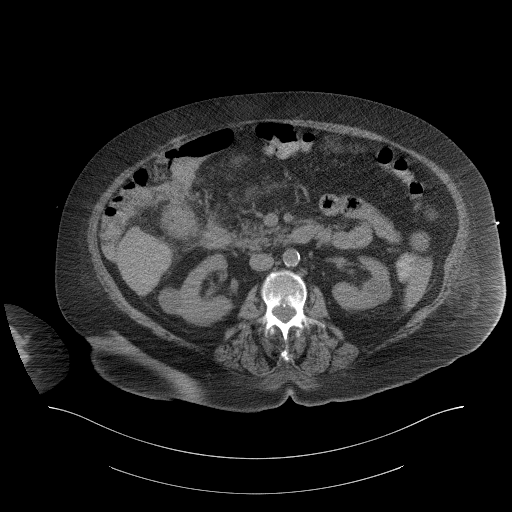
[im 21/50  soft-tissue]
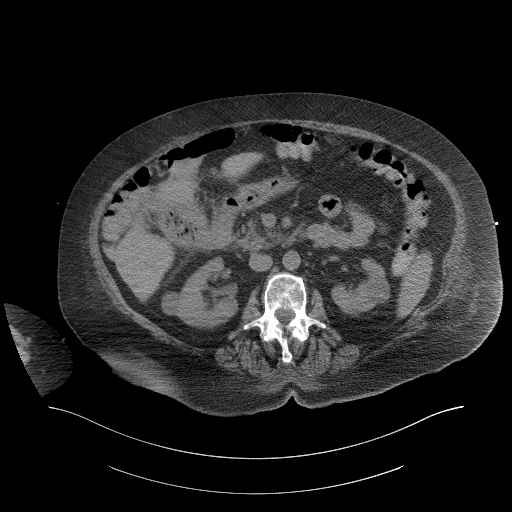
[im 26/50  soft-tissue]
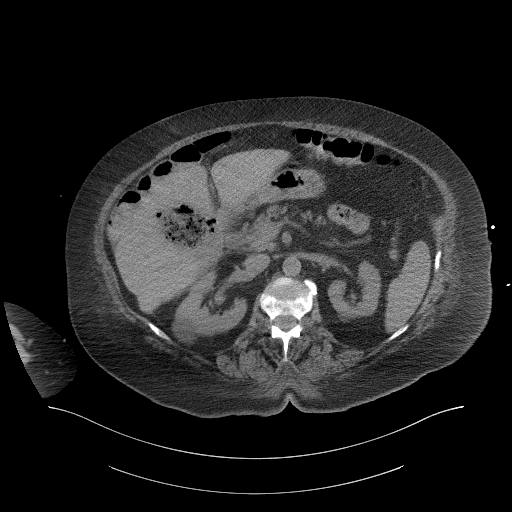
[im 29/50  soft-tissue]
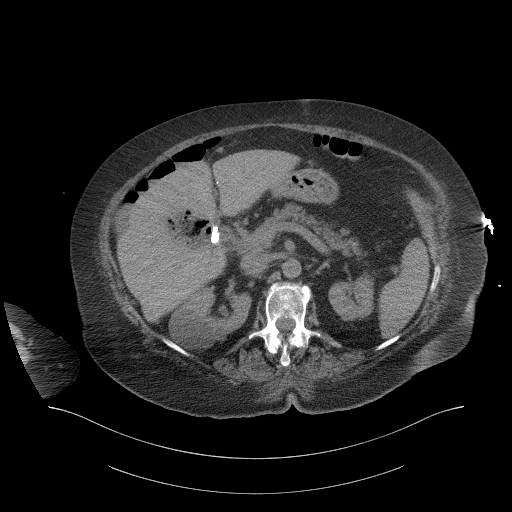
[im 31/50  soft-tissue]
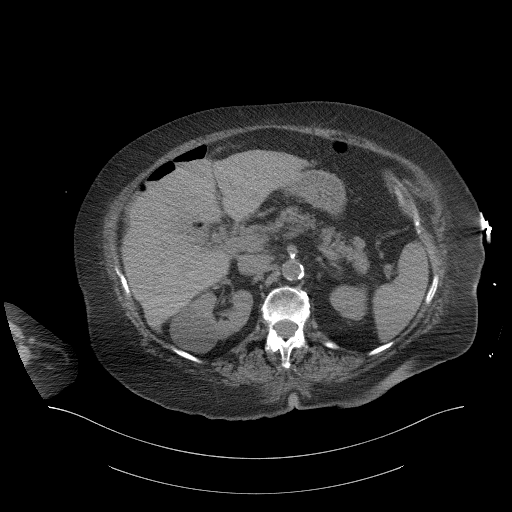
[im 31/50  bone]
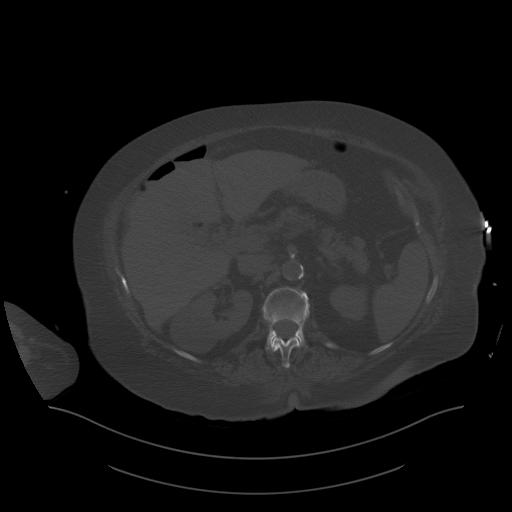
[im 37/50  soft-tissue]
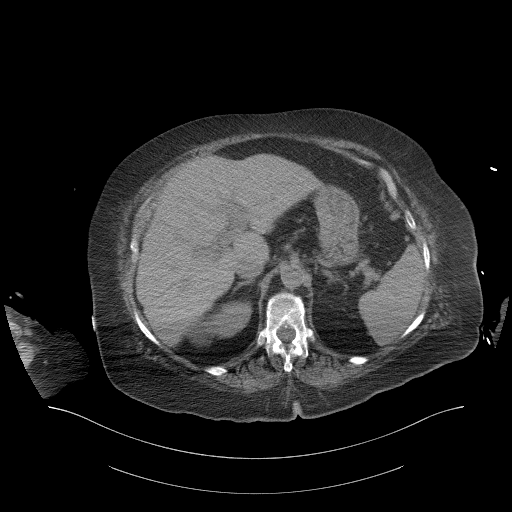
[im 39/50  soft-tissue]
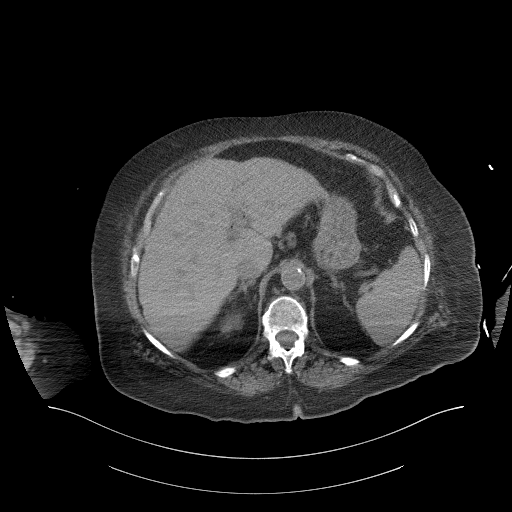
[im 39/50  lung]
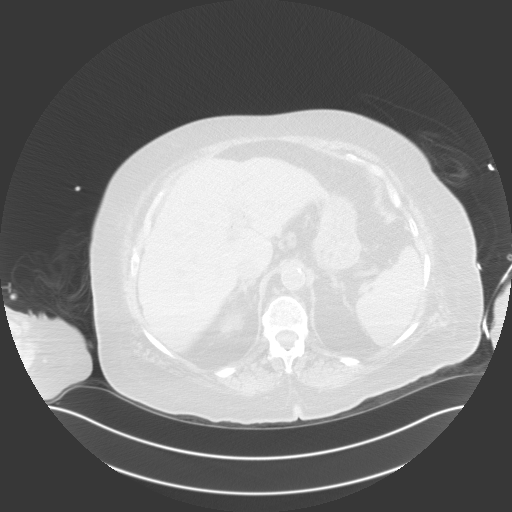
[im 42/50  soft-tissue]
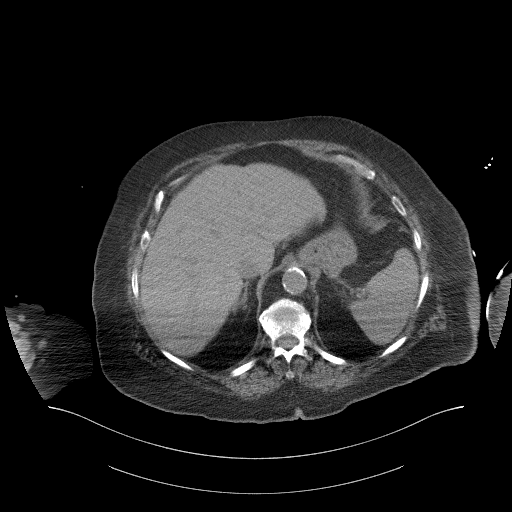
[im 42/50  lung]
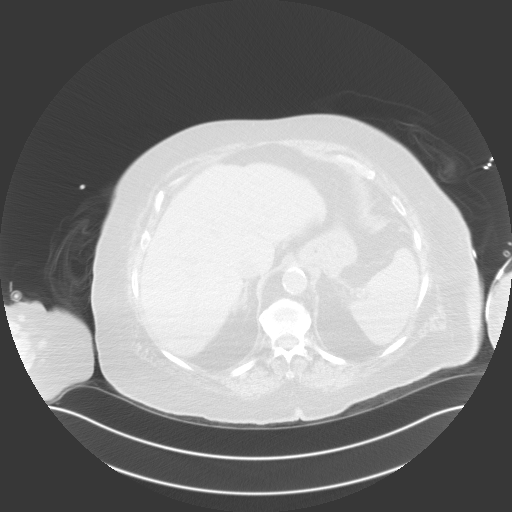
[im 44/50  lung]
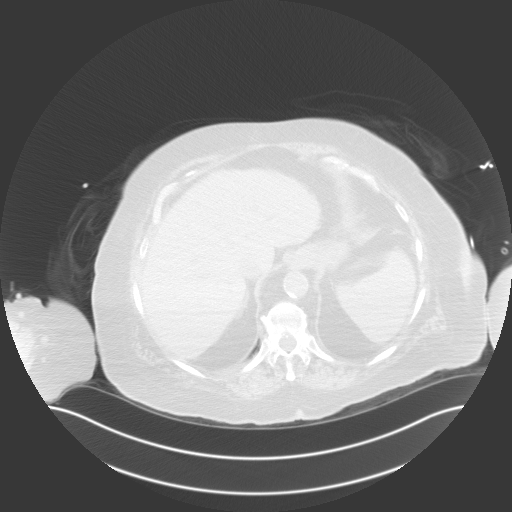
[im 47/50  soft-tissue]
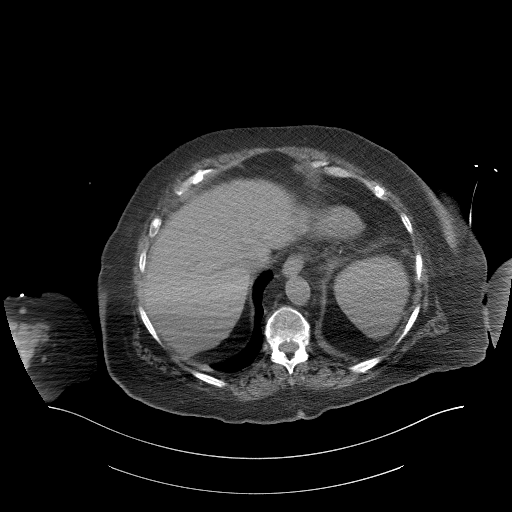
[im 47/50  lung]
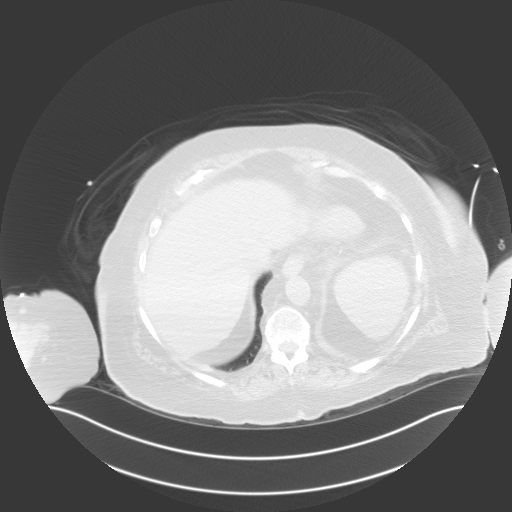

[14 of 32 positions shown; findings below may reference images not displayed]

EXAM:
CT GUIDED DRAINAGE OF GALLBLADDER FOSSA ABSCESS

ANESTHESIA/SEDATION:
Intravenous Fentanyl 155mcg and Versed 2mg were administered as
conscious sedation during continuous monitoring of the patient's
level of consciousness and physiological / cardiorespiratory status
by the radiology RN, with a total moderate sedation time of 19
minutes.

PROCEDURE:
The procedure, risks, benefits, and alternatives were explained to
the patient. Questions regarding the procedure were encouraged and
answered. The patient understands and consents to the procedure.

Select axial scans through the upper abdomen were obtained. An
appropriate skin entry site was determined and marked.

The operative field was prepped with chlorhexidinein a sterile
fashion, and a sterile drape was applied covering the operative
field. A sterile gown and sterile gloves were used for the
procedure. Local anesthesia was provided with 1% Lidocaine.

18 gauge trocar needle was advanced into the subhepatic collection
using trans pedicle approach taking care to avoid the colon. An
Amplatz guidewire formed within the collection. Tract dilated to
facilitate placement of a 10 French pigtail drain catheter, formed
centrally within the collection. Thick opaque clot-like material
could be aspirated, sample sent for Gram stain and culture. Catheter
secured with 0 Prolene suture and StatLock and placed to gravity
drain bag. The patient tolerated the procedure well.

COMPLICATIONS:
None immediate
FINDINGS: Loculated gas and fluid collection in the gallbladder fossa was
localized. 10 French pigtail drain catheter placed as above. 5 mL of
thick aspirate sent for Gram stain and culture.
IMPRESSION: 1. Technically successful CT-guided abscess drain catheter
placement, gallbladder fossa.

RADIATION DOSE REDUCTION: This exam was performed according to the
departmental dose-optimization program which includes automated
exposure control, adjustment of the mA and/or kV according to
patient size and/or use of iterative reconstruction technique.

## 2023-10-31 IMAGING — CT CT ABD-PELV W/ CM
2 of 4 series · 14 of 46 positions shown, 16 images · IV contrast (agent unspecified)
Comparison: ERCP, 03/09/2022.

CLINICAL DATA: s/p gallbladder fossa abscess drain [DATE]

EXAM:
CT ABDOMEN AND PELVIS WITH CONTRAST
TECHNIQUE: Multidetector CT imaging of the abdomen and pelvis was performed
using the standard protocol following bolus administration of
intravenous contrast.

[Series 2: abd pelvis 5.00 br40 s3 axial · axial · 0.71mm/px · z∈[+1217,+1577]mm · 11 of 86 slices shown, 13 images]
[im 7/86  soft-tissue]
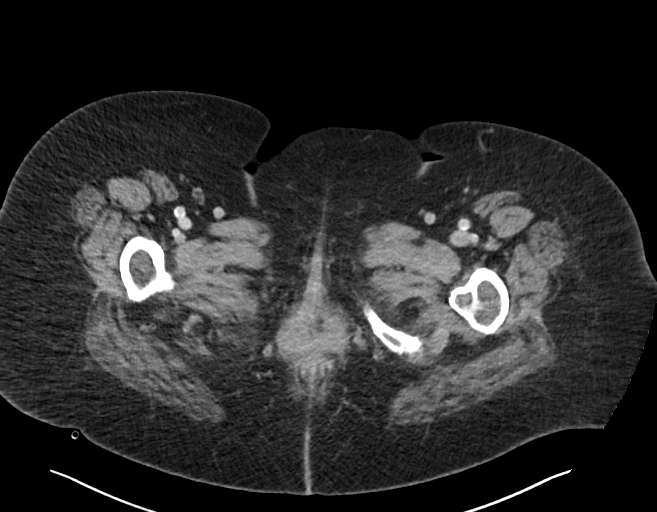
[im 7/86  bone]
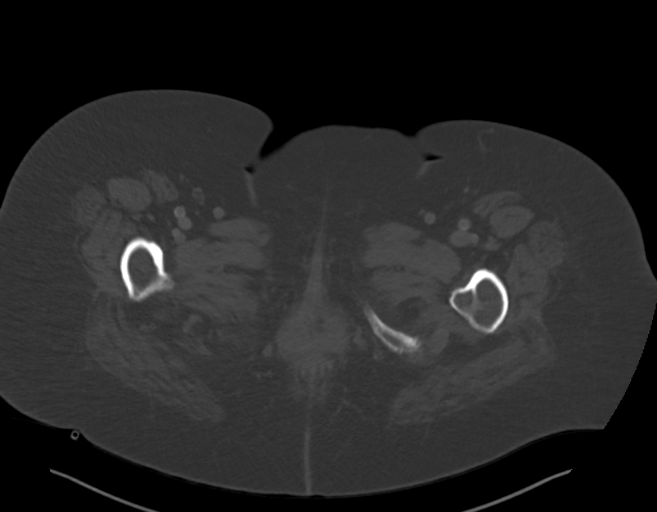
[im 14/86  soft-tissue]
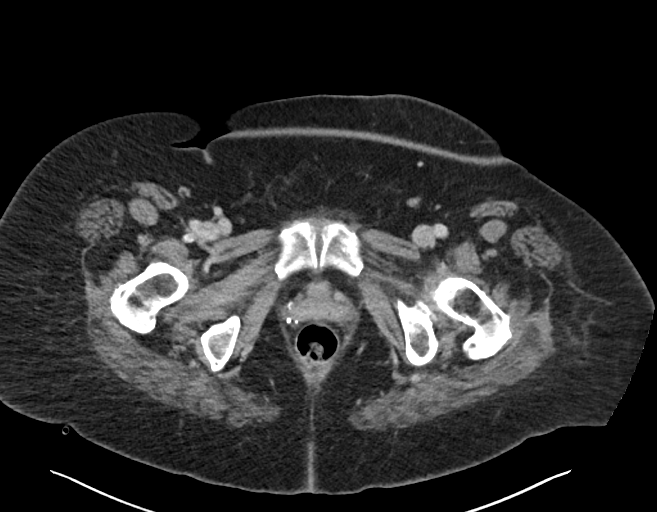
[im 21/86  soft-tissue]
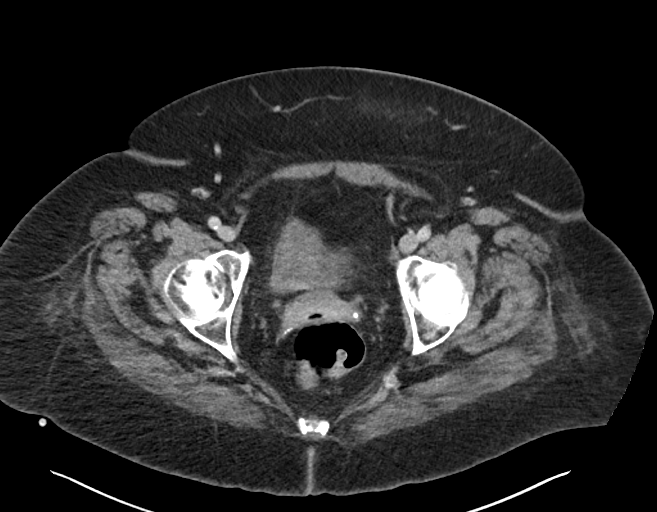
[im 28/86  soft-tissue]
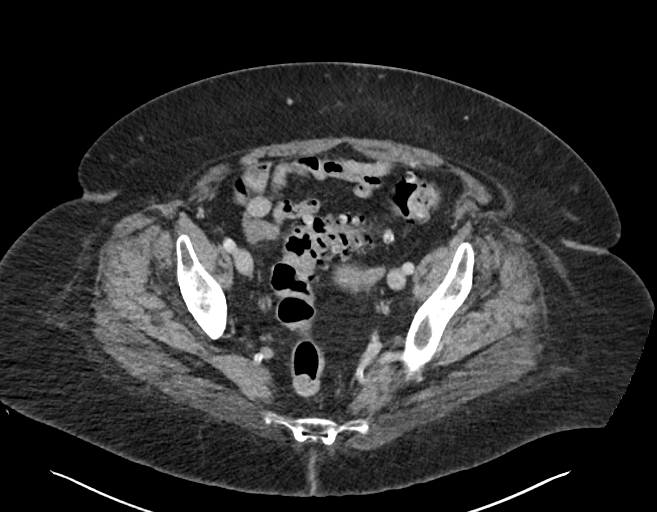
[im 35/86  soft-tissue]
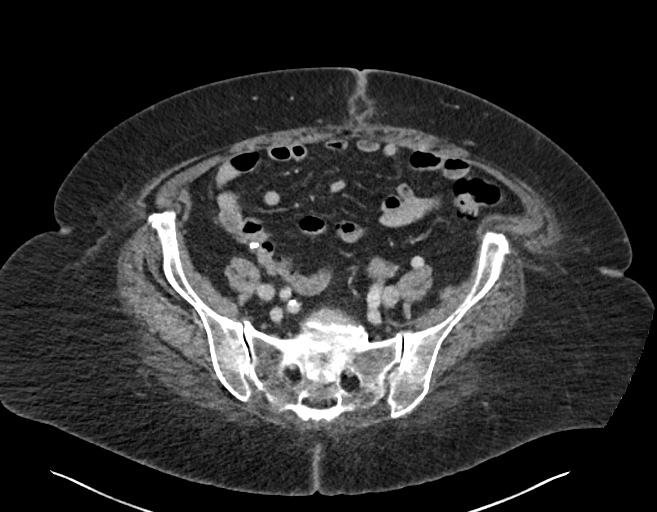
[im 45/86  soft-tissue]
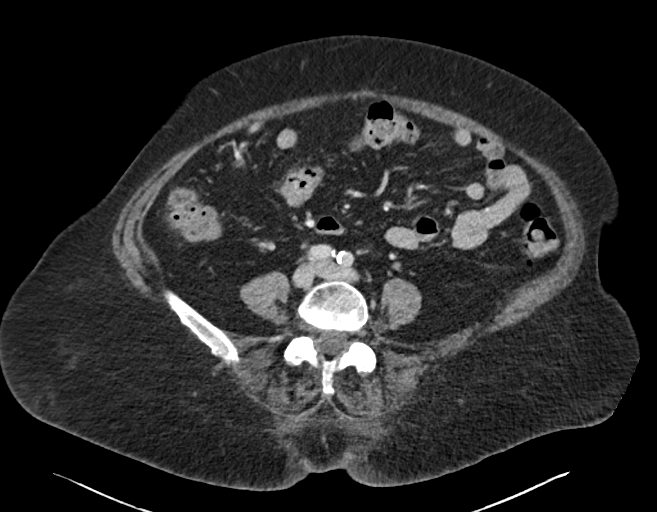
[im 52/86  soft-tissue]
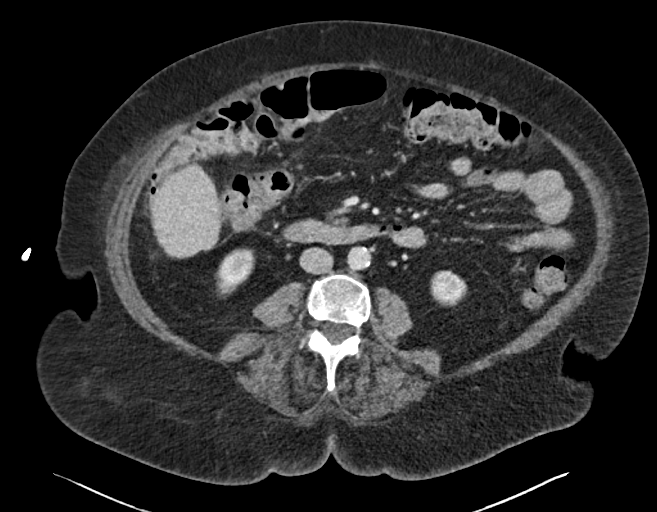
[im 58/86  soft-tissue]
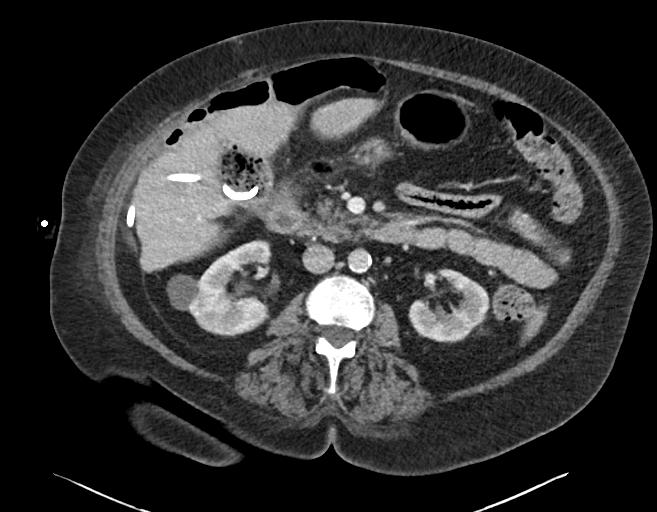
[im 65/86  soft-tissue]
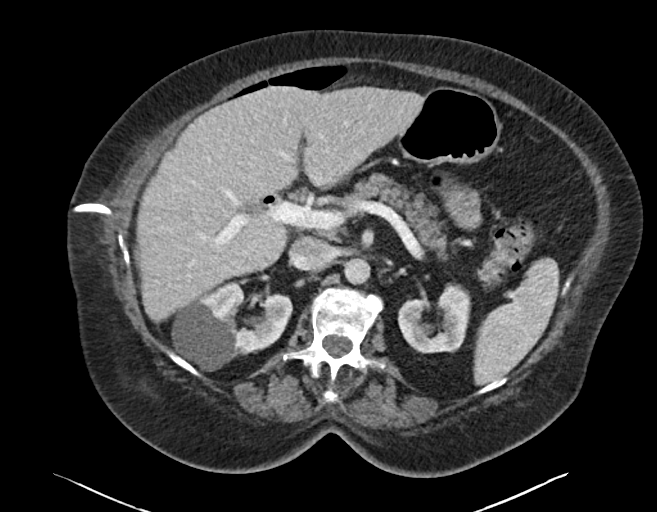
[im 65/86  bone]
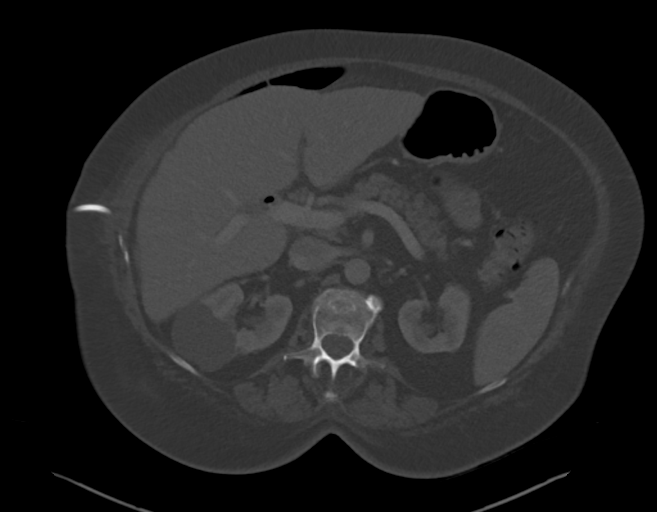
[im 72/86  soft-tissue]
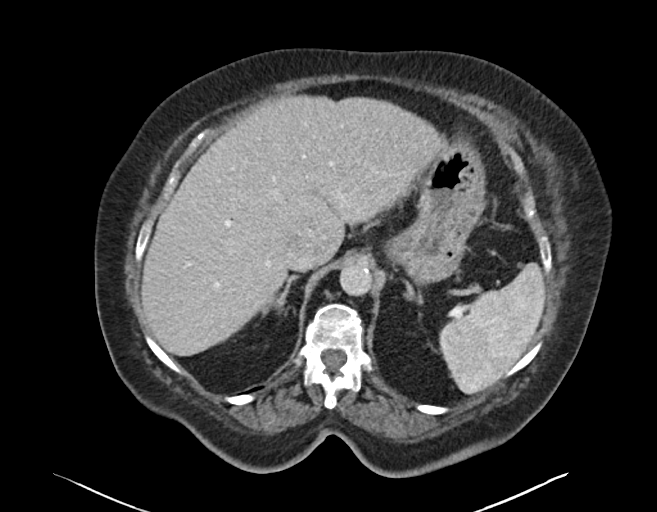
[im 79/86  soft-tissue]
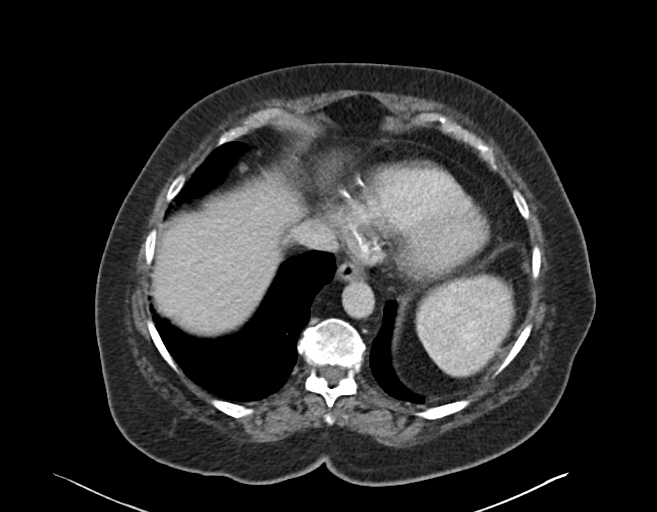

[Series 6: abd pelvis 2.00 br40 s3 cor · coronal · 0.84mm/px · 3 of 181 slices shown]
[im 61/181  soft-tissue]
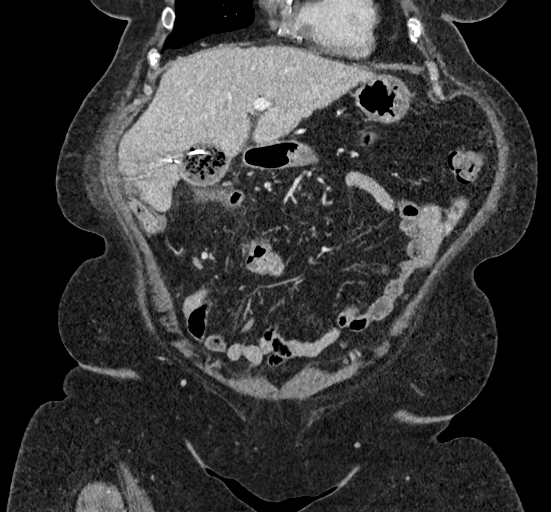
[im 81/181  soft-tissue]
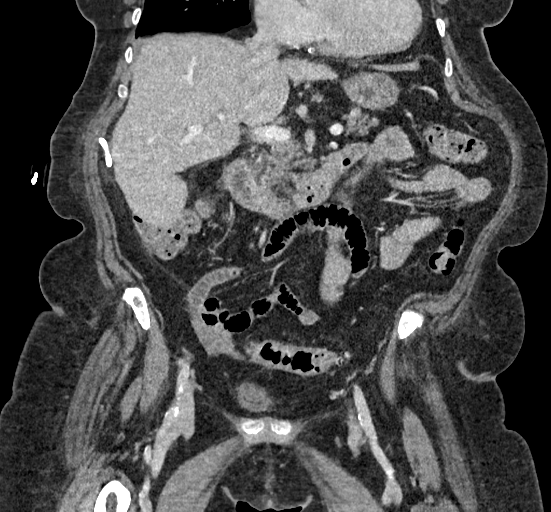
[im 101/181  soft-tissue]
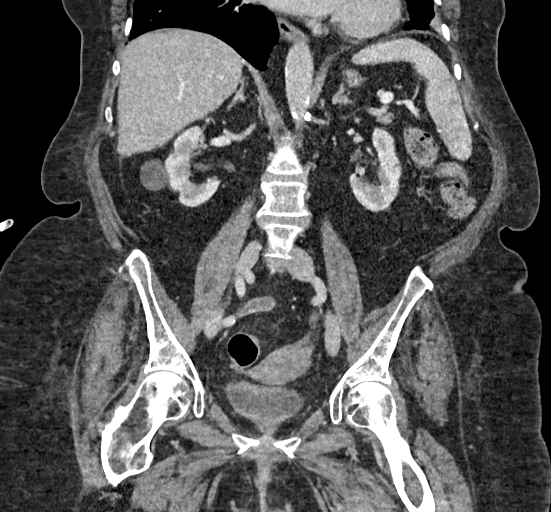

[14 of 46 positions shown; findings below may reference images not displayed]

RADIATION DOSE REDUCTION: This exam was performed according to the
departmental dose-optimization program which includes automated
exposure control, adjustment of the mA and/or kV according to
patient size and/or use of iterative reconstruction technique.

CONTRAST:  100mL O5S1CA-VQQ IOPAMIDOL (O5S1CA-VQQ) INJECTION 61%
IR CT, 03/11/2021.

Patient's comparison imaging under a different MRN Germosen, Kira

## 2023-11-15 IMAGING — CT CT ABD-PELV W/ CM
2 of 4 series · 13 of 46 positions shown, 15 images · IV contrast (agent unspecified)
Comparison: 03/09/2022, 03/11/2022, 03/27/2022

CLINICAL DATA: 75-year-old female with history of cholelithiasis
status post cholecystectomy complicated postoperatively by
gallbladder fossa abscess status post percutaneous drain placement
on 03/11/2022.

EXAM:
CT ABDOMEN AND PELVIS WITH CONTRAST
TECHNIQUE: Multidetector CT imaging of the abdomen and pelvis was performed
using the standard protocol following bolus administration of
intravenous contrast.

[Series 2: abd pelvis 5.00 br40 s3 axial · axial · 0.69mm/px · z∈[+1242,+1617]mm · 10 of 91 slices shown, 12 images]
[im 8/91  soft-tissue]
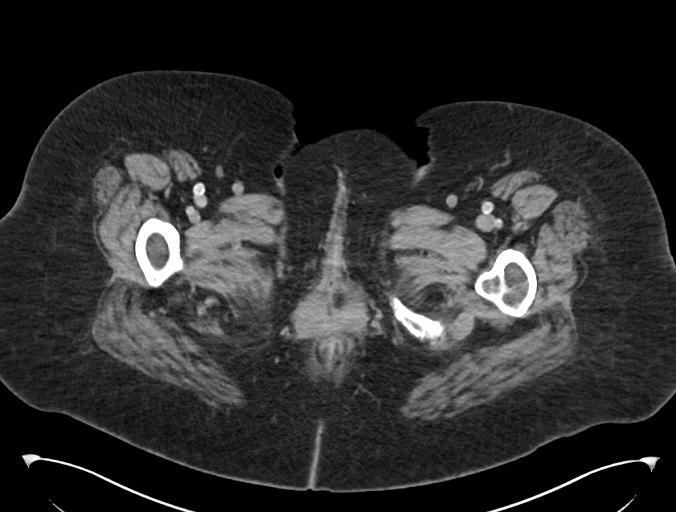
[im 8/91  bone]
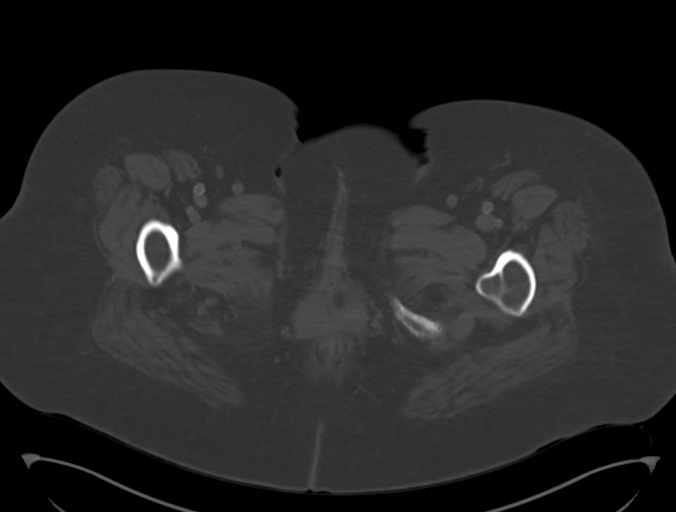
[im 16/91  soft-tissue]
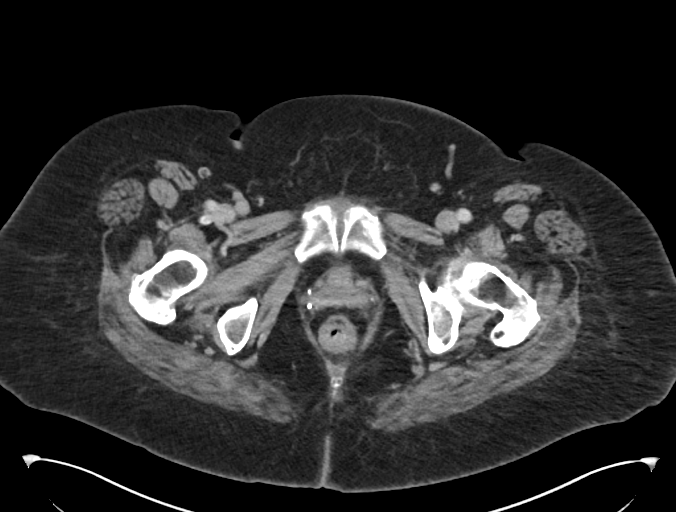
[im 24/91  soft-tissue]
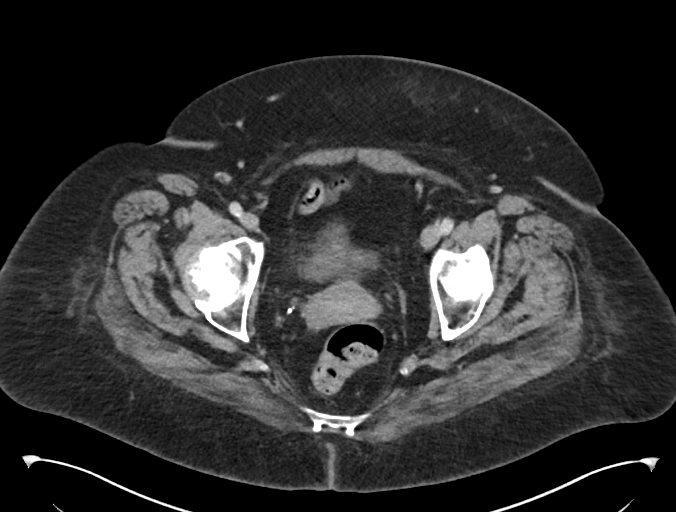
[im 32/91  soft-tissue]
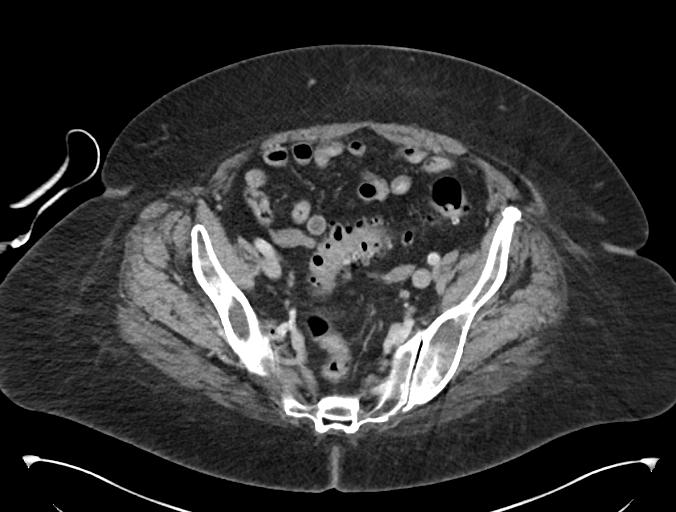
[im 40/91  soft-tissue]
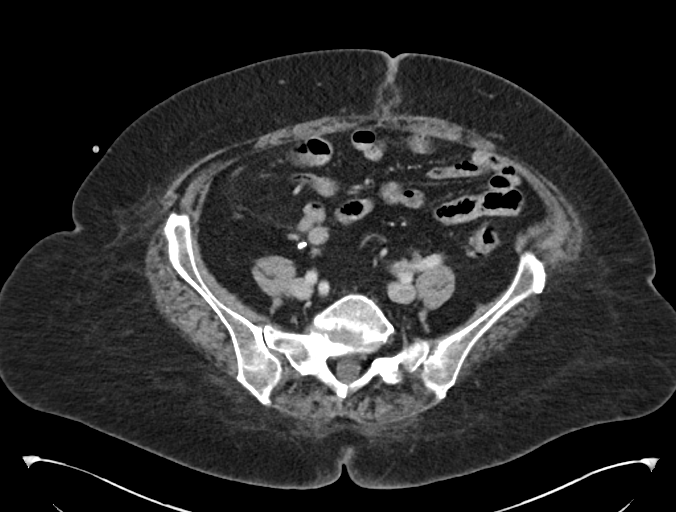
[im 51/91  soft-tissue]
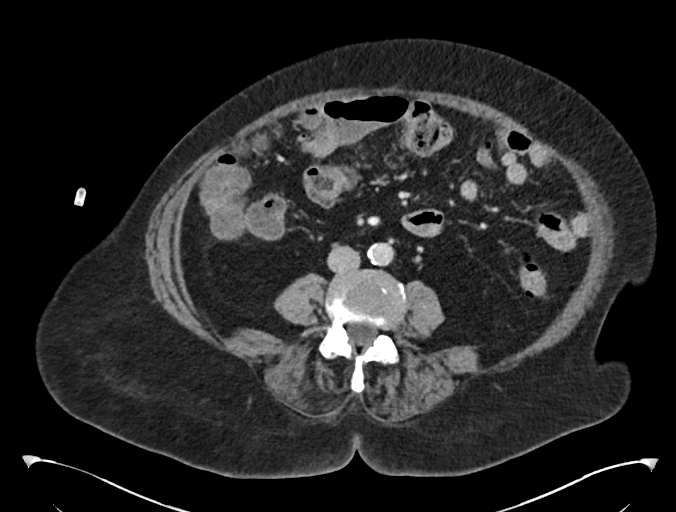
[im 59/91  soft-tissue]
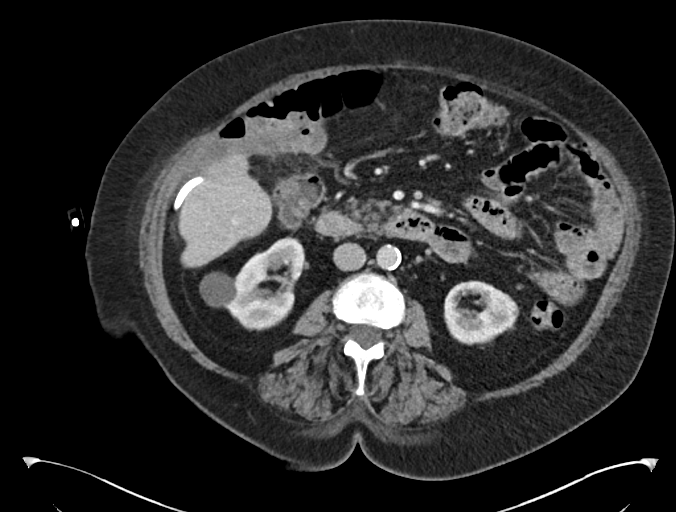
[im 67/91  soft-tissue]
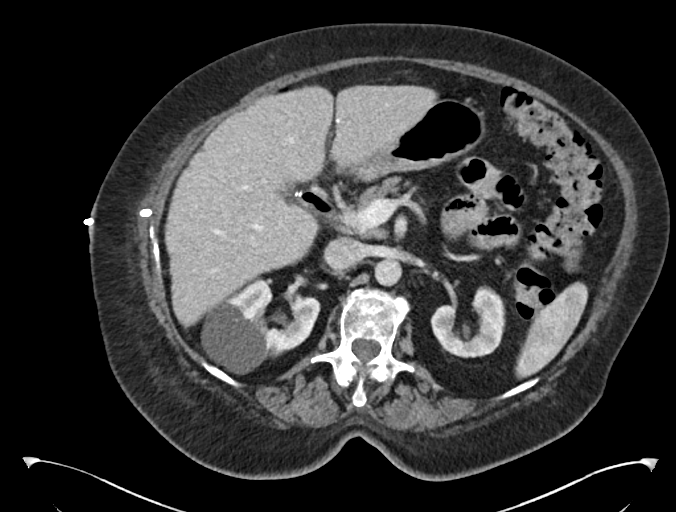
[im 75/91  soft-tissue]
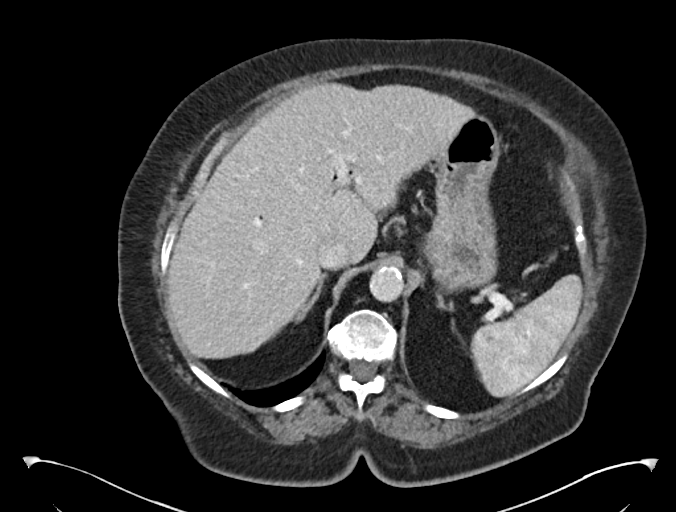
[im 75/91  bone]
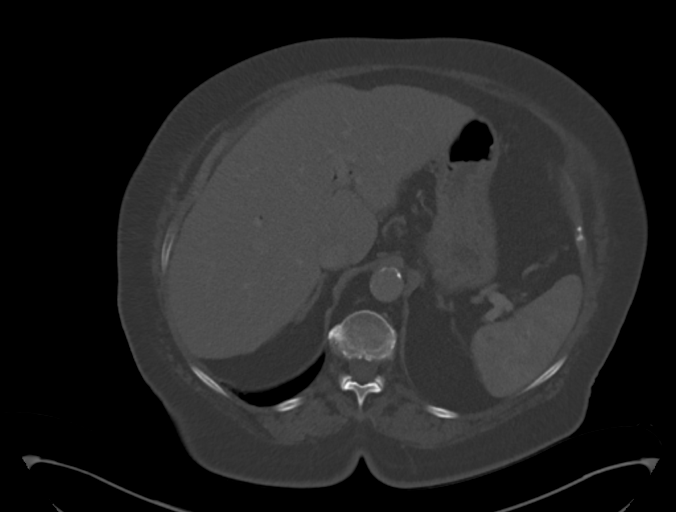
[im 83/91  soft-tissue]
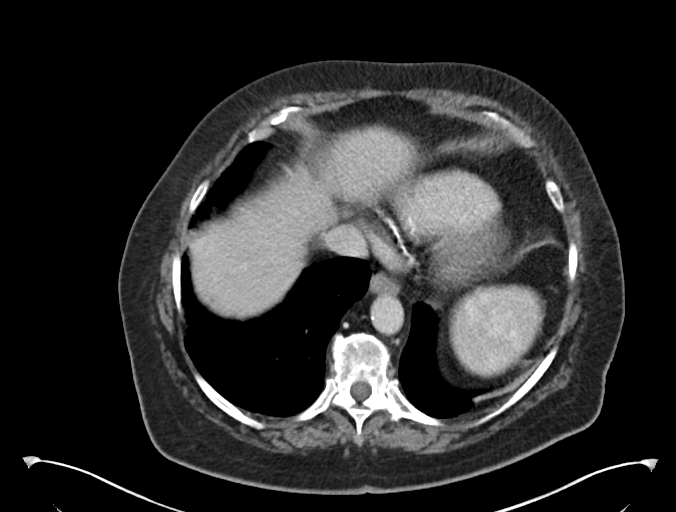

[Series 6: abd pelvis 2.00 br40 s3 cor · coronal · 0.89mm/px · 3 of 175 slices shown]
[im 59/175  soft-tissue]
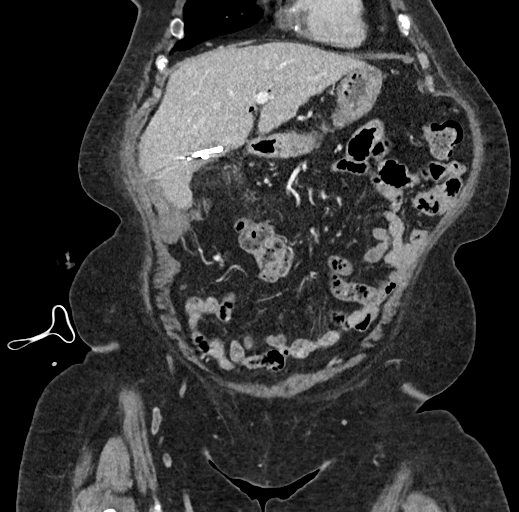
[im 78/175  soft-tissue]
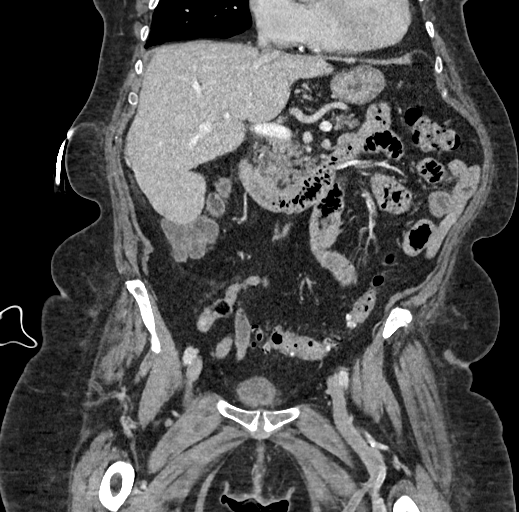
[im 97/175  soft-tissue]
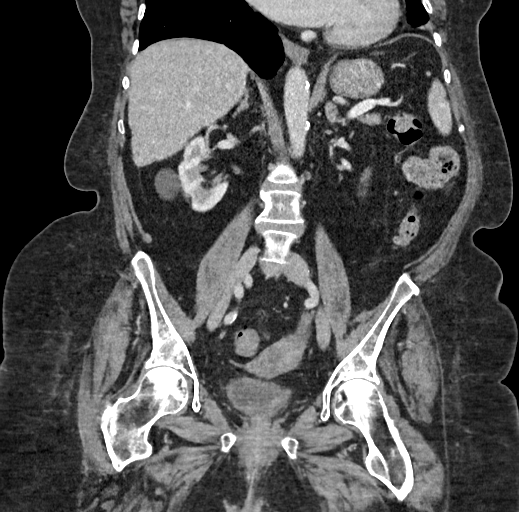

[13 of 46 positions shown; findings below may reference images not displayed]

RADIATION DOSE REDUCTION: This exam was performed according to the
departmental dose-optimization program which includes automated
exposure control, adjustment of the mA and/or kV according to
patient size and/or use of iterative reconstruction technique.

CONTRAST:  80mL EPL45S-U2T IOPAMIDOL (EPL45S-U2T) INJECTION 76%
FINDINGS: Lower chest: No acute abnormality.

Hepatobiliary: Liver is normal in size with mild surface contour
nodularity, unchanged. Mild, similar appearing pneumobilia.
Postsurgical changes after cholecystectomy. Complete resolution of
previously visualized gallbladder fossa fluid collection with
unchanged position of indwelling pigtail drain.

Pancreas: Unremarkable. No pancreatic ductal dilatation or
surrounding inflammatory changes.

Spleen: Normal in size without focal abnormality.

Adrenals/Urinary Tract: Adrenal glands are unremarkable. Again seen
are multifocal bilateral simple renal cysts, the largest exophytic
and arising from the right superior pole measuring up to 4.9 cm.
Kidneys are otherwise normal, without renal calculi, focal lesion,
or hydronephrosis. Bladder is decompressed.

Stomach/Bowel: Stomach is within normal limits. Appendix appears
normal. Sigmoid diverticula without surrounding inflammatory
changes. No evidence of bowel wall thickening, distention, or
inflammatory changes.

Vascular/Lymphatic: Aortic atherosclerosis. No enlarged abdominal or
pelvic lymph nodes.

Reproductive: Uterus and bilateral adnexa are unremarkable.

Other: Tiny fat containing umbilical hernia.  No ascites.

Musculoskeletal: No acute or significant osseous findings.
IMPRESSION: 1. Resolution of previously visualized gallbladder fossa fluid
collection with unchanged position of indwelling drain.
2. Morphologic changes suggestive of hepatic cirrhosis. No signs of
portal hypertension.
3.  Aortic Atherosclerosis ([id number]).
